# Patient Record
Sex: Female | Born: 1990 | Marital: Married | Smoking: Never smoker
Health system: Southern US, Community
[De-identification: ages and names within clinical notes are randomized; demographics above are authoritative.]

## PROBLEM LIST (undated history)

## (undated) DIAGNOSIS — B019 Varicella without complication: Secondary | ICD-10-CM

## (undated) DIAGNOSIS — J45909 Unspecified asthma, uncomplicated: Secondary | ICD-10-CM

## (undated) DIAGNOSIS — L511 Stevens-Johnson syndrome: Secondary | ICD-10-CM

## (undated) DIAGNOSIS — G43909 Migraine, unspecified, not intractable, without status migrainosus: Secondary | ICD-10-CM

## (undated) HISTORY — DX: Migraine, unspecified, not intractable, without status migrainosus: G43.909

## (undated) HISTORY — DX: Unspecified asthma, uncomplicated: J45.909

## (undated) HISTORY — PX: NO PAST SURGERIES: SHX2092

## (undated) HISTORY — DX: Varicella without complication: B01.9

---

## 2013-02-12 DIAGNOSIS — D649 Anemia, unspecified: Secondary | ICD-10-CM | POA: Insufficient documentation

## 2016-06-09 ENCOUNTER — Ambulatory Visit (INDEPENDENT_AMBULATORY_CARE_PROVIDER_SITE_OTHER): Payer: BC Managed Care – PPO | Admitting: Family Medicine

## 2016-06-09 ENCOUNTER — Encounter: Payer: Self-pay | Admitting: Family Medicine

## 2016-06-09 VITALS — BP 108/88 | HR 77 | Temp 98.3°F | Resp 14 | Ht 68.0 in | Wt 168.5 lb

## 2016-06-09 DIAGNOSIS — R0789 Other chest pain: Secondary | ICD-10-CM

## 2016-06-09 DIAGNOSIS — Z0000 Encounter for general adult medical examination without abnormal findings: Secondary | ICD-10-CM | POA: Insufficient documentation

## 2016-06-09 DIAGNOSIS — Z0001 Encounter for general adult medical examination with abnormal findings: Secondary | ICD-10-CM | POA: Diagnosis not present

## 2016-06-09 DIAGNOSIS — R079 Chest pain, unspecified: Secondary | ICD-10-CM | POA: Insufficient documentation

## 2016-06-09 LAB — COMPREHENSIVE METABOLIC PANEL
ALBUMIN: 4.7 g/dL (ref 3.5–5.2)
ALK PHOS: 50 U/L (ref 39–117)
ALT: 10 U/L (ref 0–35)
AST: 13 U/L (ref 0–37)
BILIRUBIN TOTAL: 0.5 mg/dL (ref 0.2–1.2)
BUN: 14 mg/dL (ref 6–23)
CALCIUM: 9.7 mg/dL (ref 8.4–10.5)
CO2: 29 mEq/L (ref 19–32)
CREATININE: 0.77 mg/dL (ref 0.40–1.20)
Chloride: 104 mEq/L (ref 96–112)
GFR: 95.37 mL/min (ref >60.00)
Glucose, Bld: 78 mg/dL (ref 70–99)
Potassium: 3.7 mEq/L (ref 3.5–5.1)
Sodium: 139 mEq/L (ref 135–145)
Total Protein: 8.1 g/dL (ref 6.0–8.3)

## 2016-06-09 LAB — CBC
HCT: 41.4 % (ref 36.0–46.0)
Hemoglobin: 13.9 g/dL (ref 12.0–15.0)
MCHC: 33.7 g/dL (ref 30.0–36.0)
MCV: 90.1 fl (ref 78.0–100.0)
PLATELETS: 275 10*3/uL (ref 150.0–400.0)
RBC: 4.6 Mil/uL (ref 3.87–5.11)
RDW: 12.6 % (ref 11.5–15.5)
WBC: 4.9 10*3/uL (ref 4.0–10.5)

## 2016-06-09 LAB — LIPID PANEL
CHOLESTEROL: 173 mg/dL (ref 0–200)
HDL: 63 mg/dL (ref >39.00)
LDL Cholesterol: 101 mg/dL — ABNORMAL HIGH (ref 0–99)
NonHDL: 110.17
TRIGLYCERIDES: 47 mg/dL (ref 0.0–149.0)
Total CHOL/HDL Ratio: 3
VLDL: 9.4 mg/dL (ref 0.0–40.0)

## 2016-06-09 LAB — TSH: TSH: 1 u[IU]/mL (ref 0.35–4.50)

## 2016-06-09 MED ORDER — MELOXICAM 15 MG PO TABS: 15.0000 mg | ORAL_TABLET | Freq: Every day | ORAL | 0 refills | Status: DC | PRN

## 2016-06-09 NOTE — Assessment & Plan Note (Signed)
New problem. History and physical exam consistent with musculoskeletal etiology. Treating with mobic. No indication for further work up given presentation, age and lack of risk factors.

## 2016-06-09 NOTE — Progress Notes (Signed)
Pre visit review using our clinic review tool, if applicable. No additional management support is needed unless otherwise documented below in the visit note. 

## 2016-06-09 NOTE — Patient Instructions (Signed)
Your chest pain is muscular in origin. Use the medication daily as needed.  We will call with your lab results.  Follow up annually or sooner if needed  Take care  Dr. Lacinda Axon  Health Maintenance, Female Adopting a healthy lifestyle and getting preventive care can go a long way to promote health and wellness. Talk with your health care provider about what schedule of regular examinations is right for you. This is a good chance for you to check in with your provider about disease prevention and staying healthy. In between checkups, there are plenty of things you can do on your own. Experts have done a lot of research about which lifestyle changes and preventive measures are most likely to keep you healthy. Ask your health care provider for more information. WEIGHT AND DIET  Eat a healthy diet  Be sure to include plenty of vegetables, fruits, low-fat dairy products, and lean protein.  Do not eat a lot of foods high in solid fats, added sugars, or salt.  Get regular exercise. This is one of the most important things you can do for your health.  Most adults should exercise for at least 150 minutes each week. The exercise should increase your heart rate and make you sweat (moderate-intensity exercise).  Most adults should also do strengthening exercises at least twice a week. This is in addition to the moderate-intensity exercise.  Maintain a healthy weight  Body mass index (BMI) is a measurement that can be used to identify possible weight problems. It estimates body fat based on height and weight. Your health care provider can help determine your BMI and help you achieve or maintain a healthy weight.  For females 23 years of age and older:   A BMI below 18.5 is considered underweight.  A BMI of 18.5 to 24.9 is normal.  A BMI of 25 to 29.9 is considered overweight.  A BMI of 30 and above is considered obese.  Watch levels of cholesterol and blood lipids  You should start having  your blood tested for lipids and cholesterol at 27 years of age, then have this test every 5 years.  You may need to have your cholesterol levels checked more often if:  Your lipid or cholesterol levels are high.  You are older than 27 years of age.  You are at high risk for heart disease.  CANCER SCREENING   Lung Cancer  Lung cancer screening is recommended for adults 97-1 years old who are at high risk for lung cancer because of a history of smoking.  A yearly low-dose CT scan of the lungs is recommended for people who:  Currently smoke.  Have quit within the past 15 years.  Have at least a 30-pack-year history of smoking. A pack year is smoking an average of one pack of cigarettes a day for 1 year.  Yearly screening should continue until it has been 15 years since you quit.  Yearly screening should stop if you develop a health problem that would prevent you from having lung cancer treatment.  Breast Cancer  Practice breast self-awareness. This means understanding how your breasts normally appear and feel.  It also means doing regular breast self-exams. Let your health care provider know about any changes, no matter how small.  If you are in your 20s or 30s, you should have a clinical breast exam (CBE) by a health care provider every 1-3 years as part of a regular health exam.  If you are 40 or older,  have a CBE every year. Also consider having a breast X-ray (mammogram) every year.  If you have a family history of breast cancer, talk to your health care provider about genetic screening.  If you are at high risk for breast cancer, talk to your health care provider about having an MRI and a mammogram every year.  Breast cancer gene (BRCA) assessment is recommended for women who have family members with BRCA-related cancers. BRCA-related cancers include:  Breast.  Ovarian.  Tubal.  Peritoneal cancers.  Results of the assessment will determine the need for genetic  counseling and BRCA1 and BRCA2 testing. Cervical Cancer Your health care provider may recommend that you be screened regularly for cancer of the pelvic organs (ovaries, uterus, and vagina). This screening involves a pelvic examination, including checking for microscopic changes to the surface of your cervix (Pap test). You may be encouraged to have this screening done every 3 years, beginning at age 21.  For women ages 30-65, health care providers may recommend pelvic exams and Pap testing every 3 years, or they may recommend the Pap and pelvic exam, combined with testing for human papilloma virus (HPV), every 5 years. Some types of HPV increase your risk of cervical cancer. Testing for HPV may also be done on women of any age with unclear Pap test results.  Other health care providers may not recommend any screening for nonpregnant women who are considered low risk for pelvic cancer and who do not have symptoms. Ask your health care provider if a screening pelvic exam is right for you.  If you have had past treatment for cervical cancer or a condition that could lead to cancer, you need Pap tests and screening for cancer for at least 20 years after your treatment. If Pap tests have been discontinued, your risk factors (such as having a new sexual partner) need to be reassessed to determine if screening should resume. Some women have medical problems that increase the chance of getting cervical cancer. In these cases, your health care provider may recommend more frequent screening and Pap tests. Colorectal Cancer  This type of cancer can be detected and often prevented.  Routine colorectal cancer screening usually begins at 27 years of age and continues through 27 years of age.  Your health care provider may recommend screening at an earlier age if you have risk factors for colon cancer.  Your health care provider may also recommend using home test kits to check for hidden blood in the stool.  A  small camera at the end of a tube can be used to examine your colon directly (sigmoidoscopy or colonoscopy). This is done to check for the earliest forms of colorectal cancer.  Routine screening usually begins at age 50.  Direct examination of the colon should be repeated every 5-10 years through 27 years of age. However, you may need to be screened more often if early forms of precancerous polyps or small growths are found. Skin Cancer  Check your skin from head to toe regularly.  Tell your health care provider about any new moles or changes in moles, especially if there is a change in a mole's shape or color.  Also tell your health care provider if you have a mole that is larger than the size of a pencil eraser.  Always use sunscreen. Apply sunscreen liberally and repeatedly throughout the day.  Protect yourself by wearing long sleeves, pants, a wide-brimmed hat, and sunglasses whenever you are outside. HEART DISEASE, DIABETES, AND   HIGH BLOOD PRESSURE   High blood pressure causes heart disease and increases the risk of stroke. High blood pressure is more likely to develop in:  People who have blood pressure in the high end of the normal range (130-139/85-89 mm Hg).  People who are overweight or obese.  People who are African American.  If you are 32-68 years of age, have your blood pressure checked every 3-5 years. If you are 41 years of age or older, have your blood pressure checked every year. You should have your blood pressure measured twice--once when you are at a hospital or clinic, and once when you are not at a hospital or clinic. Record the average of the two measurements. To check your blood pressure when you are not at a hospital or clinic, you can use:  An automated blood pressure machine at a pharmacy.  A home blood pressure monitor.  If you are between 56 years and 69 years old, ask your health care provider if you should take aspirin to prevent strokes.  Have  regular diabetes screenings. This involves taking a blood sample to check your fasting blood sugar level.  If you are at a normal weight and have a low risk for diabetes, have this test once every three years after 27 years of age.  If you are overweight and have a high risk for diabetes, consider being tested at a younger age or more often. PREVENTING INFECTION  Hepatitis B  If you have a higher risk for hepatitis B, you should be screened for this virus. You are considered at high risk for hepatitis B if:  You were born in a country where hepatitis B is common. Ask your health care provider which countries are considered high risk.  Your parents were born in a high-risk country, and you have not been immunized against hepatitis B (hepatitis B vaccine).  You have HIV or AIDS.  You use needles to inject street drugs.  You live with someone who has hepatitis B.  You have had sex with someone who has hepatitis B.  You get hemodialysis treatment.  You take certain medicines for conditions, including cancer, organ transplantation, and autoimmune conditions. Hepatitis C  Blood testing is recommended for:  Everyone born from [birth year] through 1967.  Anyone with known risk factors for hepatitis C. Sexually transmitted infections (STIs)  You should be screened for sexually transmitted infections (STIs) including gonorrhea and chlamydia if:  You are sexually active and are younger than 27 years of age.  You are older than 27 years of age and your health care provider tells you that you are at risk for this type of infection.  Your sexual activity has changed since you were last screened and you are at an increased risk for chlamydia or gonorrhea. Ask your health care provider if you are at risk.  If you do not have HIV, but are at risk, it may be recommended that you take a prescription medicine daily to prevent HIV infection. This is called pre-exposure prophylaxis (PrEP). You are  considered at risk if:  You are sexually active and do not regularly use condoms or know the HIV status of your partner(s).  You take drugs by injection.  You are sexually active with a partner who has HIV. Talk with your health care provider about whether you are at high risk of being infected with HIV. If you choose to begin PrEP, you should first be tested for HIV. You should then be tested  every 3 months for as long as you are taking PrEP.  PREGNANCY   If you are premenopausal and you may become pregnant, ask your health care provider about preconception counseling.  If you may become pregnant, take 400 to 800 micrograms (mcg) of folic acid every day.  If you want to prevent pregnancy, talk to your health care provider about birth control (contraception). OSTEOPOROSIS AND MENOPAUSE   Osteoporosis is a disease in which the bones lose minerals and strength with aging. This can result in serious bone fractures. Your risk for osteoporosis can be identified using a bone density scan.  If you are 45 years of age or older, or if you are at risk for osteoporosis and fractures, ask your health care provider if you should be screened.  Ask your health care provider whether you should take a calcium or vitamin D supplement to lower your risk for osteoporosis.  Menopause may have certain physical symptoms and risks.  Hormone replacement therapy may reduce some of these symptoms and risks. Talk to your health care provider about whether hormone replacement therapy is right for you.  HOME CARE INSTRUCTIONS   Schedule regular health, dental, and eye exams.  Stay current with your immunizations.   Do not use any tobacco products including cigarettes, chewing tobacco, or electronic cigarettes.  If you are pregnant, do not drink alcohol.  If you are breastfeeding, limit how much and how often you drink alcohol.  Limit alcohol intake to no more than 1 drink per day for nonpregnant women. One  drink equals 12 ounces of beer, 5 ounces of wine, or 1 ounces of hard liquor.  Do not use street drugs.  Do not share needles.  Ask your health care provider for help if you need support or information about quitting drugs.  Tell your health care provider if you often feel depressed.  Tell your health care provider if you have ever been abused or do not feel safe at home.   This information is not intended to replace advice given to you by your health care provider. Make sure you discuss any questions you have with your health care provider.   Document Released: 01/31/2011 Document Revised: 08/08/2014 Document Reviewed: 06/19/2013 Elsevier Interactive Patient Education [employer].

## 2016-06-09 NOTE — Progress Notes (Signed)
Subjective:  Patient ID: Rachel Compton, female    DOB: June 27, 1991  Age: 25 y.o. MRN: [id number]  CC: Establish care/physical, chest pain  HPI Rachel Compton is a 25 y.o. female presents to the clinic today to establish care and for a physical exam.  She complains of chest pain.  Preventative Healthcare  Pap smear: Up to date.  Immunizations  Tetanus - Up to date.  Flu - Declines.   Labs: Labs today.  Exercise: No regular exercise.  Alcohol use: See below.  Smoking/tobacco use: Occasional smoker (Black and Mild)  STD/HIV testing: Has had screening with most recent pregnancy.  Chest pain  Patient reports that she's had chest pain since this past Saturday.  Pain is located in the mid sternum.  She describes it as a pressure sensation.  Mild in severity.  No radiation.  No diaphoresis or nausea/vomiting. No shortness of breath.  She states is worse with movement and with lying on her stomach.  No known relieving factors.  No medications or interventions tried.  No risk factors.  No other complaints at this time.   PMH, Surgical Hx, Family Hx, Social History reviewed and updated as below.  Past Medical History:  Diagnosis Date  . Asthma   . Chicken pox    Past Surgical History:  Procedure Laterality Date  . NO PAST SURGERIES     Family History  Problem Relation Age of Onset  . Adopted: Yes   Social History  Substance Use Topics  . Smoking status: Light Tobacco Smoker  . Smokeless tobacco: Never Used  . Alcohol use 3.0 oz/week    5 Standard drinks or equivalent per week   Review of Systems  Constitutional: Positive for fatigue.  Cardiovascular: Positive for chest pain.  Neurological: Positive for headaches.  All other systems reviewed and are negative.  Objective:   Today's Vitals: BP 108/88 (BP Location: Right Arm, Patient Position: Sitting, Cuff Size: Normal)   Pulse 77   Temp 98.3 F (36.8 C) (Oral)   Resp 14   Ht _0  (1.727 m)    Wt 168 lb 8 oz (76.4 kg)   SpO2 98%   BMI 25.62 kg/m   Physical Exam  Constitutional: She is oriented to person, place, and time. She appears well-developed and well-nourished. No distress.  HENT:  Head: Normocephalic and atraumatic.  Nose: Nose normal.  Mouth/Throat: Oropharynx is clear and moist. No oropharyngeal exudate.  Normal TM's bilaterally.   Eyes: Conjunctivae are normal. No scleral icterus.  Neck: Neck supple.  ? Thyromegaly.  Cardiovascular: Normal rate and regular rhythm.   No murmur heard. Pulmonary/Chest: Effort normal and breath sounds normal. She has no wheezes. She has no rales. She exhibits tenderness.  Abdominal: Soft. She exhibits no distension. There is no tenderness. There is no rebound and no guarding.  Musculoskeletal: Normal range of motion. She exhibits no edema.  Lymphadenopathy:    She has no cervical adenopathy.  Neurological: She is alert and oriented to person, place, and time.  Skin: Skin is warm and dry. No rash noted.  Psychiatric: She has a normal mood and affect.  Vitals reviewed.  Assessment & Plan:   Problem List Items Addressed This Visit    Chest pain    New problem. History and physical exam consistent with musculoskeletal etiology. Treating with mobic. No indication for further work up given presentation, age and lack of risk factors.      Annual physical exam - Primary    Pap  smear up to date. Declined flu vaccine. Tetanus up-to-date. Screening labs today.      Relevant Orders   CBC   Comp Met (CMET)   Lipid panel   TSH      Outpatient Encounter Prescriptions as of 06/09/2016  Medication Sig  . meloxicam (MOBIC) 15 MG tablet Take 1 tablet (15 mg total) by mouth daily as needed for pain.   No facility-administered encounter medications on file as of 06/09/2016.     Follow-up: Annually  [place]

## 2016-06-09 NOTE — Assessment & Plan Note (Signed)
Pap smear up to date. Declined flu vaccine. Tetanus up-to-date. Screening labs today.

## 2016-06-10 ENCOUNTER — Encounter: Payer: Self-pay | Admitting: *Deleted

## 2016-12-28 ENCOUNTER — Ambulatory Visit (INDEPENDENT_AMBULATORY_CARE_PROVIDER_SITE_OTHER): Payer: BC Managed Care – PPO | Admitting: Family Medicine

## 2016-12-28 DIAGNOSIS — J329 Chronic sinusitis, unspecified: Secondary | ICD-10-CM | POA: Insufficient documentation

## 2016-12-28 DIAGNOSIS — J019 Acute sinusitis, unspecified: Secondary | ICD-10-CM

## 2016-12-28 MED ORDER — HYDROCOD POLST-CPM POLST ER 10-8 MG/5ML PO SUER: 5.0000 mL | Freq: Two times a day (BID) | ORAL | 0 refills | Status: DC | PRN

## 2016-12-28 MED ORDER — DOXYCYCLINE HYCLATE 100 MG PO TABS: 100.0000 mg | ORAL_TABLET | Freq: Two times a day (BID) | ORAL | 0 refills | Status: DC

## 2016-12-28 NOTE — Progress Notes (Signed)
Subjective:  Patient ID: Rachel Compton, female    DOB: April 14, 1991  Age: 26 y.o. MRN: [id number]  CC: URI symptoms.  HPI:  26 year old female presents with URI symptoms.  Patient reports that she has been sick since Friday. She has been experiencing severe nasal/sinus congestion and cough. Cough is worse at night. Yellow mucus when blowing nose. No fever. She has been using Claritin D, nasal saline, theraflu without improvement. No known exacerbating factors. No other associated symptoms.  Additionally, patient reports that she has developed several palpable nodules on her arms over the past months. Nontender. Not bothersome. She is curious to what the cause is.  Social Hx   Social History   Social History  . Marital status: Single    Spouse name: N/A  . Number of children: N/A  . Years of education: N/A   Social History Main Topics  . Smoking status: Light Tobacco Smoker  . Smokeless tobacco: Never Used  . Alcohol use 3.0 oz/week    5 Standard drinks or equivalent per week  . Drug use: No  . Sexual activity: Yes    Partners: Male   Other Topics Concern  . Not on file   Social History Narrative  . No narrative on file    Review of Systems  HENT: Positive for congestion.   Respiratory: Positive for cough.    Objective:  BP 108/76 (BP Location: Right Arm, Patient Position: Sitting, Cuff Size: Normal)   Pulse 83   Temp 98.5 F (36.9 C) (Oral)   Resp 16   Wt 168 lb 4 oz (76.3 kg)   SpO2 96%   BMI 25.58 kg/m   BP/Weight 12/28/2016 06/09/2016  Systolic BP 108 108  Diastolic BP 76 88  Wt. (Lbs) 168.25 168.5  BMI 25.58 25.62    Physical Exam  Constitutional: She appears well-developed. No distress.  HENT:  Head: Normocephalic and atraumatic.  Mouth/Throat: Oropharynx is clear and moist.  Neck: Neck supple.  Cardiovascular: Normal rate and regular rhythm.   No murmur heard. Pulmonary/Chest: Effort normal and breath sounds normal. She has no wheezes. She has  no rales.  Lymphadenopathy:    She has no cervical adenopathy.  Skin:  Patient has several palpable nodules (in the subcutaneous tissue) on both forearms (ventrally; distal to the elbow).   Vitals reviewed.   Lab Results  Component Value Date   WBC 4.9 06/09/2016   HGB 13.9 06/09/2016   HCT 41.4 06/09/2016   PLT 275.0 06/09/2016   GLUCOSE 78 06/09/2016   CHOL 173 06/09/2016   TRIG 47.0 06/09/2016   HDL 63.00 06/09/2016   LDLCALC 101 (H) 06/09/2016   ALT 10 06/09/2016   AST 13 06/09/2016   NA 139 06/09/2016   K 3.7 06/09/2016   CL 104 06/09/2016   CREATININE 0.77 06/09/2016   BUN 14 06/09/2016   CO2 29 06/09/2016   TSH 1.00 06/09/2016    Assessment & Plan:   Problem List Items Addressed This Visit      Respiratory   Acute sinusitis    New problem.  Treating with Doxy (given PCN allergy). Tussionex for cough.       Relevant Medications   doxycycline (VIBRA-TABS) 100 MG tablet   chlorpheniramine-HYDROcodone (TUSSIONEX PENNKINETIC ER) 10-8 MG/5ML SUER      Meds ordered this encounter  Medications  . doxycycline (VIBRA-TABS) 100 MG tablet    Sig: Take 1 tablet (100 mg total) by mouth 2 (two) times daily.  Dispense:  14 tablet    Refill:  0  . chlorpheniramine-HYDROcodone (TUSSIONEX PENNKINETIC ER) 10-8 MG/5ML SUER    Sig: Take 5 mLs by mouth every 12 (twelve) hours as needed.    Dispense:  115 mL    Refill:  0   Follow-up: PRN  Everlene Other DO [facility]

## 2016-12-28 NOTE — Patient Instructions (Signed)
Medications as prescribed.  Feel better.   Sinusitis, Adult Sinusitis is soreness and inflammation of your sinuses. Sinuses are hollow spaces in the bones around your face. Your sinuses are located:  Around your eyes.  In the middle of your forehead.  Behind your nose.  In your cheekbones. Your sinuses and nasal passages are lined with a stringy fluid (mucus). Mucus normally drains out of your sinuses. When your nasal tissues become inflamed or swollen, the mucus can become trapped or blocked so air cannot flow through your sinuses. This allows bacteria, viruses, and funguses to grow, which leads to infection. Sinusitis can develop quickly and last for 7?10 days (acute) or for more than 12 weeks (chronic). Sinusitis often develops after a cold. What are the causes? This condition is caused by anything that creates swelling in the sinuses or stops mucus from draining, including:  Allergies.  Asthma.  Bacterial or viral infection.  Abnormally shaped bones between the nasal passages.  Nasal growths that contain mucus (nasal polyps).  Narrow sinus openings.  Pollutants, such as chemicals or irritants in the air.  A foreign object stuck in the nose.  A fungal infection. This is rare. What increases the risk? The following factors may make you more likely to develop this condition:  Having allergies or asthma.  Having had a recent cold or respiratory tract infection.  Having structural deformities or blockages in your nose or sinuses.  Having a weak immune system.  Doing a lot of swimming or diving.  Overusing nasal sprays.  Smoking. What are the signs or symptoms? The main symptoms of this condition are pain and a feeling of pressure around the affected sinuses. Other symptoms include:  Upper toothache.  Earache.  Headache.  Bad breath.  Decreased sense of smell and taste.  A cough that may get worse at night.  Fatigue.  Fever.  Thick drainage from  your nose. The drainage is often green and it may contain pus (purulent).  Stuffy nose or congestion.  Postnasal drip. This is when extra mucus collects in the throat or back of the nose.  Swelling and warmth over the affected sinuses.  Sore throat.  Sensitivity to light. How is this diagnosed? This condition is diagnosed based on symptoms, a medical history, and a physical exam. To find out if your condition is acute or chronic, your health care provider may:  Look in your nose for signs of nasal polyps.  Tap over the affected sinus to check for signs of infection.  View the inside of your sinuses using an imaging device that has a light attached (endoscope). If your health care provider suspects that you have chronic sinusitis, you may also:  Be tested for allergies.  Have a sample of mucus taken from your nose (nasal culture) and checked for bacteria.  Have a mucus sample examined to see if your sinusitis is related to an allergy. If your sinusitis does not respond to treatment and it lasts longer than 8 weeks, you may have an MRI or CT scan to check your sinuses. These scans also help to determine how severe your infection is. In rare cases, a bone biopsy may be done to rule out more serious types of fungal sinus disease. How is this treated? Treatment for sinusitis depends on the cause and whether your condition is chronic or acute. If a virus is causing your sinusitis, your symptoms will go away on their own within 10 days. You may be given medicines to relieve  your symptoms, including:  Topical nasal decongestants. They shrink swollen nasal passages and let mucus drain from your sinuses.  Antihistamines. These drugs block inflammation that is triggered by allergies. This can help to ease swelling in your nose and sinuses.  Topical nasal corticosteroids. These are nasal sprays that ease inflammation and swelling in your nose and sinuses.  Nasal saline washes. These rinses  can help to get rid of thick mucus in your nose. If your condition is caused by bacteria, you will be given an antibiotic medicine. If your condition is caused by a fungus, you will be given an antifungal medicine. Surgery may be needed to correct underlying conditions, such as narrow nasal passages. Surgery may also be needed to remove polyps. Follow these instructions at home: Medicines   Take, use, or apply over-the-counter and prescription medicines only as told by your health care provider. These may include nasal sprays.  If you were prescribed an antibiotic medicine, take it as told by your health care provider. Do not stop taking the antibiotic even if you start to feel better. Hydrate and Humidify   Drink enough water to keep your urine clear or pale yellow. Staying hydrated will help to thin your mucus.  Use a cool mist humidifier to keep the humidity level in your home above 50%.  Inhale steam for 10-15 minutes, 3-4 times a day or as told by your health care provider. You can do this in the bathroom while a hot shower is running.  Limit your exposure to cool or dry air. Rest   Rest as much as possible.  Sleep with your head raised (elevated).  Make sure to get enough sleep each night. General instructions   Apply a warm, moist washcloth to your face 3-4 times a day or as told by your health care provider. This will help with discomfort.  Wash your hands often with soap and water to reduce your exposure to viruses and other germs. If soap and water are not available, use hand sanitizer.  Do not smoke. Avoid being around people who are smoking (secondhand smoke).  Keep all follow-up visits as told by your health care provider. This is important. Contact a health care provider if:  You have a fever.  Your symptoms get worse.  Your symptoms do not improve within 10 days. Get help right away if:  You have a severe headache.  You have persistent vomiting.  You have  pain or swelling around your face or eyes.  You have vision problems.  You develop confusion.  Your neck is stiff.  You have trouble breathing. This information is not intended to replace advice given to you by your health care provider. Make sure you discuss any questions you have with your health care provider. Document Released: 07/18/2005 Document Revised: 03/13/2016 Document Reviewed: 05/13/2015 Elsevier Interactive Patient Education  2017 [employer].

## 2016-12-28 NOTE — Assessment & Plan Note (Signed)
New problem.  Treating with Doxy (given PCN allergy). Tussionex for cough.

## 2017-01-02 ENCOUNTER — Telehealth: Payer: Self-pay | Admitting: *Deleted

## 2017-01-02 ENCOUNTER — Other Ambulatory Visit: Payer: Self-pay | Admitting: Family Medicine

## 2017-01-02 MED ORDER — LEVOFLOXACIN 500 MG PO TABS: 500.0000 mg | ORAL_TABLET | Freq: Every day | ORAL | 0 refills | Status: DC

## 2017-01-02 NOTE — Telephone Encounter (Signed)
Patient stopped her antibiotic on 12/30/16, due to it making her sick , pt has only taken 3 doses. Please advise if she should have another antibiotic called into the pharmacy  Pt contact [phone]

## 2017-01-02 NOTE — Telephone Encounter (Signed)
Stop doxy. Start levaquin. Rx sent.

## 2017-01-02 NOTE — Telephone Encounter (Signed)
Patient came in for sinusitis, was given Doxycycline, please advise as this is making her sick. thanks

## 2017-01-03 NOTE — Telephone Encounter (Signed)
Spoke with the patient and gave new information.  She will pick up new RX today, thanks

## 2017-02-23 ENCOUNTER — Ambulatory Visit (INDEPENDENT_AMBULATORY_CARE_PROVIDER_SITE_OTHER): Payer: BC Managed Care – PPO | Admitting: Family

## 2017-02-23 ENCOUNTER — Ambulatory Visit: Payer: BC Managed Care – PPO | Admitting: Family Medicine

## 2017-02-23 ENCOUNTER — Encounter: Payer: Self-pay | Admitting: Family

## 2017-02-23 VITALS — BP 124/70 | HR 60 | Temp 97.9°F | Resp 12 | Ht 68.0 in | Wt 170.0 lb

## 2017-02-23 DIAGNOSIS — R519 Headache, unspecified: Secondary | ICD-10-CM | POA: Insufficient documentation

## 2017-02-23 DIAGNOSIS — R51 Headache: Secondary | ICD-10-CM

## 2017-02-23 DIAGNOSIS — R140 Abdominal distension (gaseous): Secondary | ICD-10-CM

## 2017-02-23 LAB — POCT URINE PREGNANCY: Preg Test, Ur: NEGATIVE

## 2017-02-23 MED ORDER — FLUTICASONE PROPIONATE 50 MCG/ACT NA SUSP: 2.0000 | Freq: Every day | NASAL | 2 refills | Status: DC

## 2017-02-23 NOTE — Patient Instructions (Addendum)
As discussed today, I'm reassured by your neurologic exam. If you change your mind and would like an injection inour office, please let us know. Otherwise as discussed, hydration is key, and taking Excedrin may cause rebound headache. I advise you to try to taper off on these medications.  As also discussed, your headache may be from sinus pressure, allergies, stay on the Claritin and startFlonase see if this helps with the headache trigger.  As far as bloating, nausea, you seem to be a bit constipated. may start over-the-counter Metamucil which you can add to water.   increase her water intake  may also try a stool softener such as Colace   Please follow-up in a couple weeks so we can regroup and ensure the symptoms have improved. Let me know if symptoms worsen.    General Headache Without Cause A headache is pain or discomfort felt around the head or neck area. The specific cause of a headache may not be found. There are many causes and types of headaches. A few common ones are:  Tension headaches.  Migraine headaches.  Cluster headaches.  Chronic daily headaches.  Follow these instructions at home: Watch your condition for any changes. Take these steps to help with your condition: Managing pain  Take over-the-counter and prescription medicines only as told by your health care provider.  Lie down in a dark, quiet room when you have a headache.  If directed, apply ice to the head and neck area: ? Put ice in a plastic bag. ? Place a towel between your skin and the bag. ? Leave the ice on for 20 minutes, 2-3 times per day.  Use a heating pad or hot shower to apply heat to the head and neck area as told by your health care provider.  Keep lights dim if bright lights bother you or make your headaches worse. Eating and drinking  Eat meals on a regular schedule.  Limit alcohol use.  Decrease the amount of caffeine you drink, or stop drinking caffeine. General  instructions  Keep all follow-up visits as told by your health care provider. This is important.  Keep a headache journal to help find out what may trigger your headaches. For example, write down: ? What you eat and drink. ? How much sleep you get. ? Any change to your diet or medicines.  Try massage or other relaxation techniques.  Limit stress.  Sit up straight, and do not tense your muscles.  Do not use tobacco products, including cigarettes, chewing tobacco, or e-cigarettes. If you need help quitting, ask your health care provider.  Exercise regularly as told by your health care provider.  Sleep on a regular schedule. Get 7-9 hours of sleep, or the amount recommended by your health care provider. Contact a health care provider if:  Your symptoms are not helped by medicine.  You have a headache that is different from the usual headache.  You have nausea or you vomit.  You have a fever. Get help right away if:  Your headache becomes severe.  You have repeated vomiting.  You have a stiff neck.  You have a loss of vision.  You have problems with speech.  You have pain in the eye or ear.  You have muscular weakness or loss of muscle control.  You lose your balance or have trouble walking.  You feel faint or pass out.  You have confusion. This information is not intended to replace advice given to you by your health  care provider. Make sure you discuss any questions you have with your health care provider. Document Released: 07/18/2005 Document Revised: 12/24/2015 Document Reviewed: 11/10/2014 Elsevier Interactive Patient Education  2017 [employer]

## 2017-02-23 NOTE — Progress Notes (Signed)
Subjective:    Patient ID: Rachel Compton, female    DOB: [birth year], 26 y.o.   MRN: [id number]  CC: Rachel Compton is a 26 y.o. female who presents today for an acute visit.    HPI: Complains of HA x one week, comes and goes , better this afternoon.  Describes as bilateral temporal region. Rates 6/10. Pressure, throbbing. 'Not worst HA of life.'  Excredrin helps. Waking up with headache or gets in the afternoon. Endorses N 'a little' with HA, photosensitive. Sinus drainage, post nasal drip contributing. No vomiting, F, chills, vision changes, confusion., ear pain, sore throat.  On claritin.   H/o migraine when in highschool and 'went away.' Also complains of nausea and bloating x 2 weeks,  Improves with gas x and pepto bismal . No sudden dietary changes. Fried foods will bother stomach. NO blood in stool. Haven't eating anything suspicious or had recent antibiotics. Last BM 2 days ago. Not eating very healthy.   Negative pregnancy test at home  On mirena     No ckd. HISTORY:  Past Medical History:  Diagnosis Date  . Asthma   . Chicken pox    Past Surgical History:  Procedure Laterality Date  . NO PAST SURGERIES     Family History  Problem Relation Age of Onset  . Adopted: Yes    Allergies: Penicillins Current Outpatient Prescriptions on File Prior to Visit  Medication Sig Dispense Refill  . chlorpheniramine-HYDROcodone (TUSSIONEX PENNKINETIC ER) 10-8 MG/5ML SUER Take 5 mLs by mouth every 12 (twelve) hours as needed. (Patient not taking: Reported on 02/23/2017) 115 mL 0  . doxycycline (VIBRA-TABS) 100 MG tablet Take 1 tablet (100 mg total) by mouth 2 (two) times daily. (Patient not taking: Reported on 02/23/2017) 14 tablet 0  . levofloxacin (LEVAQUIN) 500 MG tablet Take 1 tablet (500 mg total) by mouth daily. (Patient not taking: Reported on 02/23/2017) 7 tablet 0   No current facility-administered medications on file prior to visit.     Social History  Substance Use  Topics  . Smoking status: Light Tobacco Smoker  . Smokeless tobacco: Never Used  . Alcohol use 3.0 oz/week    5 Standard drinks or equivalent per week    Review of Systems  Constitutional: Negative for chills and fever.  Eyes: Negative for visual disturbance.  Respiratory: Negative for cough.   Cardiovascular: Negative for chest pain and palpitations.  Gastrointestinal: Positive for nausea. Negative for abdominal distention, abdominal pain and vomiting.  Neurological: Positive for headaches.      Objective:    BP 124/70 (BP Location: Left Arm, Patient Position: Sitting, Cuff Size: Normal)   Pulse 60   Temp 97.9 F (36.6 C) (Oral)   Resp 12   Ht 5\' 8"  (1.727 m)   Wt 170 lb (77.1 kg)   LMP 01/18/2017 (Approximate)   SpO2 100%   BMI 25.85 kg/m    Physical Exam  Constitutional: She appears well-developed and well-nourished.  HENT:  Mouth/Throat: Uvula is midline, oropharynx is clear and moist and mucous membranes are normal.  Eyes: Pupils are equal, round, and reactive to light. Conjunctivae and EOM are normal.  Fundus normal bilaterally.   Cardiovascular: Normal rate, regular rhythm, normal heart sounds and normal pulses.   Pulmonary/Chest: Effort normal and breath sounds normal. She has no wheezes. She has no rhonchi. She has no rales.  Abdominal: Soft. Normal appearance and bowel sounds are normal. She exhibits no distension, no fluid wave, no ascites and no mass.  There is no tenderness. There is no rigidity, no rebound, no guarding and no CVA tenderness.  Neurological: She is alert. She has normal strength. No cranial nerve deficit or sensory deficit. She displays a negative Romberg sign.  Reflex Scores:      Bicep reflexes are 2+ on the right side and 2+ on the left side.      Patellar reflexes are 2+ on the right side and 2+ on the left side. Grip equal and strong bilateral upper extremities. Gait strong and steady. Able to perform rapid alternating movement without  difficulty.   Skin: Skin is warm and dry.  Psychiatric: She has a normal mood and affect. Her speech is normal and behavior is normal. Thought content normal.  Vitals reviewed.      Assessment & Plan:   Problem List Items Addressed This Visit      Other   Acute nonintractable headache - Primary    Reassured by normal neurologic exam. Offered patient IMToradol in the office, she politely declines. We jointly agreed headache could be triggered by multiple things including allergic rhinitis, stress, rebound overuse headache. We jointly agreed we'll try to treat a triggers.  patient will start on Flonase. Advised that I would patient  to come back the next couple weeks to ensure that headache has improved, sooner if not. Return precautions given.      Relevant Medications   fluticasone (FLONASE) 50 MCG/ACT nasal spray   Bloating    Reassured by benign abdominal exam. No abdominal tenderness or bloating appreciated. Discussed with patient working diagnosis of poor diet and constipation. Again, advised patient to return in couple weeks to ensure that symptom is improving. Return precautions given.      Relevant Orders   POCT urine pregnancy (Completed)         I am having Rachel Compton start on fluticasone. I am also having her maintain her doxycycline, chlorpheniramine-HYDROcodone, and levofloxacin.   Meds ordered this encounter  Medications  . fluticasone (FLONASE) 50 MCG/ACT nasal spray    Sig: Place 2 sprays into both nostrils daily.    Dispense:  16 g    Refill:  2    Order Specific Question:   Supervising Provider    Answer:   Sherlene ShamsULLO, TERESA L [[id number]]    Return precautions given.   Risks, benefits, and alternatives of the medications and treatment plan prescribed today were discussed, and patient expressed understanding.   Education regarding symptom management and diagnosis given to patient on AVS.  Continue to follow with Tommie Samsook, Jayce G, DO for routine health  maintenance.   Rachel Compton and I agreed with plan.   Rennie PlowmanMargaret Amerah Puleo, FNP

## 2017-02-23 NOTE — Assessment & Plan Note (Addendum)
Reassured by benign abdominal exam. No abdominal tenderness or bloating appreciated. Discussed with patient working diagnosis of poor diet and constipation. Again, advised patient to return in couple weeks to ensure that symptom is improving. Return precautions given.

## 2017-02-23 NOTE — Assessment & Plan Note (Addendum)
Reassured by normal neurologic exam. Offered patient IMToradol in the office, she politely declines. We jointly agreed headache could be triggered by multiple things including allergic rhinitis, stress, rebound overuse headache. We jointly agreed we'll try to treat a triggers.  patient will start on Flonase. Advised that I would patient  to come back the next couple weeks to ensure that headache has improved, sooner if not. Return precautions given.

## 2017-05-01 ENCOUNTER — Telehealth: Payer: Self-pay | Admitting: Family

## 2017-05-01 ENCOUNTER — Ambulatory Visit (INDEPENDENT_AMBULATORY_CARE_PROVIDER_SITE_OTHER): Payer: BC Managed Care – PPO | Admitting: Family

## 2017-05-01 ENCOUNTER — Encounter: Payer: Self-pay | Admitting: Family

## 2017-05-01 VITALS — BP 112/80 | HR 76 | Temp 97.8°F | Ht 68.0 in | Wt 173.0 lb

## 2017-05-01 DIAGNOSIS — M62838 Other muscle spasm: Secondary | ICD-10-CM

## 2017-05-01 DIAGNOSIS — R21 Rash and other nonspecific skin eruption: Secondary | ICD-10-CM

## 2017-05-01 MED ORDER — CYCLOBENZAPRINE HCL 5 MG PO TABS: 5.0000 mg | ORAL_TABLET | Freq: Every evening | ORAL | 0 refills | Status: DC | PRN

## 2017-05-01 NOTE — Progress Notes (Signed)
Pre visit review using our clinic review tool, if applicable. No additional management support is needed unless otherwise documented below in the visit note. 

## 2017-05-01 NOTE — Patient Instructions (Signed)
Heat Ibuprofen  Flexeril at bedtime  Do not drive or operate heavy machinery while on muscle relaxant. Please do not drink alcohol. Only take this medication as needed for acute muscle spasm at bedtime. This medication make you feel drowsy so be very careful.  Stop taking if become too drowsy or somnolent as this puts you at risk for falls. Please contact our office with any questions.    Let us know if not better or you do not hear regarding dermatology ( referral placed)   Muscle Cramps and Spasms Muscle cramps and spasms occur when a muscle or muscles tighten and you have no control over this tightening (involuntary muscle contraction). They are a common problem and can develop in any muscle. The most common place is in the calf muscles of the leg. Muscle cramps and muscle spasms are both involuntary muscle contractions, but there are some differences between the two:  Muscle cramps are painful. They come and go and may last a few seconds to 15 minutes. Muscle cramps are often more forceful and last longer than muscle spasms.  Muscle spasms may or may not be painful. They may also last just a few seconds or much longer.  Certain medical conditions, such as diabetes or Parkinson disease, can make it more likely to develop cramps or spasms. However, cramps or spasms are usually not caused by a serious underlying problem. Common causes include:  Overexertion.  Overuse from repetitive motions, or doing the same thing over and over.  Remaining in a certain position for a long period of time.  Improper preparation, form, or technique while playing a sport or doing an activity.  Dehydration.  Injury.  Side effects of some medicines.  Abnormally low levels of the salts and ions in your blood (electrolytes), especially potassium and calcium. This could happen if you are taking water pills (diuretics) or if you are pregnant.  In many cases, the cause of muscle cramps or spasms is  unknown. Follow these instructions at home:  Stay well hydrated. Drink enough fluid to keep your urine clear or pale yellow.  Try massaging, stretching, and relaxing the affected muscle.  If directed, apply heat to tight or tense muscles as often as told by your health care provider. Use the heat source that your health care provider recommends, such as a moist heat pack or a heating pad. ? Place a towel between your skin and the heat source. ? Leave the heat on for 20-30 minutes. ? Remove the heat if your skin turns bright red. This is especially important if you are unable to feel pain, heat, or cold. You may have a greater risk of getting burned.  If directed, put ice on the affected area. This may help if you are sore or have pain after a cramp or spasm. ? Put ice in a plastic bag. ? Place a towel between your skin and the bag. ? Leavethe ice on for 20 minutes, 2-3 times a day.  Take over-the-counter and prescription medicines only as told by your health care provider.  Pay attention to any changes in your symptoms. Contact a health care provider if:  Your cramps or spasms get more severe or happen more often.  Your cramps or spasms do not improve over time. This information is not intended to replace advice given to you by your health care provider. Make sure you discuss any questions you have with your health care provider. Document Released: 01/07/2002 Document Revised: 08/19/2015 Document Reviewed:  04/21/2015 Elsevier Interactive Patient Education  [employer].

## 2017-05-01 NOTE — Telephone Encounter (Signed)
close

## 2017-05-01 NOTE — Progress Notes (Signed)
Subjective:    Patient ID: Rachel Compton, female    DOB: 07-11-1991, 26 y.o.   MRN: [id number]  CC: Rachel Compton is a 26 y.o. female who presents today for an acute visit.    HPI: CC: left scapular pain x 4 days, improved Dull pain this morning, 'no longer sharp.'   Noticed work at [employer]. Improved with rest over weekend. No pain with putting hair up, getting dressed.  No pain not associated with eating.  No fever, HA, vision changes, numbness, tingling, abdominal pain.   No injury Tried tylenol, icy hot with improvement.   Also complains of right forearm bumps, several months. Waxing and waning in size. No discharge, non tender. Would like to see dermatology.      No ckd.  HISTORY:  Past Medical History:  Diagnosis Date  . Asthma   . Chicken pox    Past Surgical History:  Procedure Laterality Date  . NO PAST SURGERIES     Family History  Problem Relation Age of Onset  . Adopted: Yes    Allergies: Penicillins Current Outpatient Prescriptions on File Prior to Visit  Medication Sig Dispense Refill  . fluticasone (FLONASE) 50 MCG/ACT nasal spray Place 2 sprays into both nostrils daily. 16 g 2   No current facility-administered medications on file prior to visit.     Social History  Substance Use Topics  . Smoking status: Light Tobacco Smoker  . Smokeless tobacco: Never Used  . Alcohol use 3.0 oz/week    5 Standard drinks or equivalent per week    Review of Systems  Constitutional: Negative for chills and fever.  Eyes: Negative for visual disturbance.  Respiratory: Negative for cough.   Cardiovascular: Negative for chest pain and palpitations.  Gastrointestinal: Negative for abdominal pain, nausea and vomiting.  Musculoskeletal: Negative for back pain.  Neurological: Negative for numbness and headaches.      Objective:    BP 112/80   Pulse 76   Temp 97.8 F (36.6 C) (Oral)   Ht  (1.727 m)   Wt 173 lb (78.5 kg)   SpO2 99%   BMI 26.30  kg/m    Physical Exam  Constitutional: She appears well-developed and well-nourished.  Eyes: Conjunctivae are normal.  Cardiovascular: Normal rate, regular rhythm, normal heart sounds and normal pulses.   Pulmonary/Chest: Effort normal and breath sounds normal. She has no wheezes. She has no rhonchi. She has no rales.  Musculoskeletal:       Right shoulder: She exhibits pain and spasm. She exhibits normal range of motion, no tenderness, no bony tenderness and normal strength.       Arms: No neck tenderness appreciated.  Spasm pain over left trapezius as noted on diagram.   Left Shoulder:   No asymmetry of shoulders when comparing right and left.No pain with palpation over glenohumeral joint lines, [place]imb[place]on joint, AC joint, or bicipital groove. No pain with internal and external rotation. No pain with resisted lateral extension .  No rash on back.   Negative active painful arc sign. Negative passive arc ( Neer's). Negative drop arm  Strength and sensation normal BUE's.    Neurological: She is alert.  Skin: Skin is warm and dry. Rash noted. Rash is nodular.     3 discrete non tender nodules noted. Non fluctuant. No erythema, streaking.   Psychiatric: She has a normal mood and affect. Her speech is normal and behavior is normal. Thought content normal.  Vitals reviewed.  Assessment & Plan:   1. Rash Nonspecific at this time. Nodular. Considering lipoma. Referral dermatology for further evaluation. - Ambulatory referral to Dermatology  2. Muscle spasm Symptoms most consistent with muscle spasm. Flexeril qhs, ibuprofen during the day. Heat. Return precautions given.   - cyclobenzaprine (FLEXERIL) 5 MG tablet; Take 1 tablet (5 mg total) by mouth at bedtime as needed for muscle spasms.  Dispense: 15 tablet; Refill: 0    I have discontinued Ms. Herring's doxycycline, chlorpheniramine-HYDROcodone, and levofloxacin. I am also having her start on cyclobenzaprine. Additionally, I am  having her maintain her fluticasone.   Meds ordered this encounter  Medications  . cyclobenzaprine (FLEXERIL) 5 MG tablet    Sig: Take 1 tablet (5 mg total) by mouth at bedtime as needed for muscle spasms.    Dispense:  15 tablet    Refill:  0    Order Specific Question:   Supervising Provider    Answer:   Sherlene Shams [[id number]]    Return precautions given.   Risks, benefits, and alternatives of the medications and treatment plan prescribed today were discussed, and patient expressed understanding.   Education regarding symptom management and diagnosis given to patient on AVS.  Continue to follow with Tommie Sams, DO for routine health maintenance.   Rachel Compton and I agreed with plan.   [name], FNP

## 2017-09-28 ENCOUNTER — Encounter: Payer: Self-pay | Admitting: Family

## 2017-10-04 ENCOUNTER — Ambulatory Visit (INDEPENDENT_AMBULATORY_CARE_PROVIDER_SITE_OTHER): Payer: BC Managed Care – PPO | Admitting: Family

## 2017-10-04 ENCOUNTER — Other Ambulatory Visit (HOSPITAL_COMMUNITY)
Admission: RE | Admit: 2017-10-04 | Discharge: 2017-10-04 | Disposition: A | Discharge status: Home / Self Care | Payer: BC Managed Care – PPO | Source: Ambulatory Visit | Attending: Family | Admitting: Family

## 2017-10-04 ENCOUNTER — Encounter: Payer: Self-pay | Admitting: Family

## 2017-10-04 VITALS — BP 108/64 | HR 79 | Temp 97.9°F | Resp 16 | Ht 68.0 in | Wt 175.0 lb

## 2017-10-04 DIAGNOSIS — Z0000 Encounter for general adult medical examination without abnormal findings: Secondary | ICD-10-CM | POA: Insufficient documentation

## 2017-10-04 DIAGNOSIS — L0292 Furuncle, unspecified: Secondary | ICD-10-CM

## 2017-10-04 MED ORDER — MUPIROCIN 2 % EX OINT: 1.0000 "application " | TOPICAL_OINTMENT | Freq: Two times a day (BID) | CUTANEOUS | 1 refills | Status: DC

## 2017-10-04 NOTE — Patient Instructions (Addendum)
Fasting labs when can Referral to dermatology, OB/GYN  Health Maintenance, Female Adopting a healthy lifestyle and getting preventive care can go a long way to promote health and wellness. Talk with your health care provider about what schedule of regular examinations is right for you. This is a good chance for you to check in with your provider about disease prevention and staying healthy. In between checkups, there are plenty of things you can do on your own. Experts have done a lot of research about which lifestyle changes and preventive measures are most likely to keep you healthy. Ask your health care provider for more information. Weight and diet Eat a healthy diet  Be sure to include plenty of vegetables, fruits, low-fat dairy products, and lean protein.  Do not eat a lot of foods high in solid fats, added sugars, or salt.  Get regular exercise. This is one of the most important things you can do for your health. ? Most adults should exercise for at least 150 minutes each week. The exercise should increase your heart rate and make you sweat (moderate-intensity exercise). ? Most adults should also do strengthening exercises at least twice a week. This is in addition to the moderate-intensity exercise.  Maintain a healthy weight  Body mass index (BMI) is a measurement that can be used to identify possible weight problems. It estimates body fat based on height and weight. Your health care provider can help determine your BMI and help you achieve or maintain a healthy weight.  For females 29 years of age and older: ? A BMI below 18.5 is considered underweight. ? A BMI of 18.5 to 24.9 is normal. ? A BMI of 25 to 29.9 is considered overweight. ? A BMI of 30 and above is considered obese.  Watch levels of cholesterol and blood lipids  You should start having your blood tested for lipids and cholesterol at 29 years of age, then have this test every 5 years.  You may need to have your  cholesterol levels checked more often if: ? Your lipid or cholesterol levels are high. ? You are older than 29 years of age. ? You are at high risk for heart disease.  Cancer screening Lung Cancer  Lung cancer screening is recommended for adults 46-26 years old who are at high risk for lung cancer because of a history of smoking.  A yearly low-dose CT scan of the lungs is recommended for people who: ? Currently smoke. ? Have quit within the past 15 years. ? Have at least a 30-pack-year history of smoking. A pack year is smoking an average of one pack of cigarettes a day for 1 year.  Yearly screening should continue until it has been 15 years since you quit.  Yearly screening should stop if you develop a health problem that would prevent you from having lung cancer treatment.  Breast Cancer  Practice breast self-awareness. This means understanding how your breasts normally appear and feel.  It also means doing regular breast self-exams. Let your health care provider know about any changes, no matter how small.  If you are in your 20s or 30s, you should have a clinical breast exam (CBE) by a health care provider every 1-3 years as part of a regular health exam.  If you are 17 or older, have a CBE every year. Also consider having a breast X-ray (mammogram) every year.  If you have a family history of breast cancer, talk to your health care provider about  genetic screening.  If you are at high risk for breast cancer, talk to your health care provider about having an MRI and a mammogram every year.  Breast cancer gene (BRCA) assessment is recommended for women who have family members with BRCA-related cancers. BRCA-related cancers include: ? Breast. ? Ovarian. ? Tubal. ? Peritoneal cancers.  Results of the assessment will determine the need for genetic counseling and BRCA1 and BRCA2 testing.  Cervical Cancer Your health care provider may recommend that you be screened regularly  for cancer of the pelvic organs (ovaries, uterus, and vagina). This screening involves a pelvic examination, including checking for microscopic changes to the surface of your cervix (Pap test). You may be encouraged to have this screening done every 3 years, beginning at age 56.  For women ages 65-65, health care providers may recommend pelvic exams and Pap testing every 3 years, or they may recommend the Pap and pelvic exam, combined with testing for human papilloma virus (HPV), every 5 years. Some types of HPV increase your risk of cervical cancer. Testing for HPV may also be done on women of any age with unclear Pap test results.  Other health care providers may not recommend any screening for nonpregnant women who are considered low risk for pelvic cancer and who do not have symptoms. Ask your health care provider if a screening pelvic exam is right for you.  If you have had past treatment for cervical cancer or a condition that could lead to cancer, you need Pap tests and screening for cancer for at least 20 years after your treatment. If Pap tests have been discontinued, your risk factors (such as having a new sexual partner) need to be reassessed to determine if screening should resume. Some women have medical problems that increase the chance of getting cervical cancer. In these cases, your health care provider may recommend more frequent screening and Pap tests.  Colorectal Cancer  This type of cancer can be detected and often prevented.  Routine colorectal cancer screening usually begins at 29 years of age and continues through 29 years of age.  Your health care provider may recommend screening at an earlier age if you have risk factors for colon cancer.  Your health care provider may also recommend using home test kits to check for hidden blood in the stool.  A small camera at the end of a tube can be used to examine your colon directly (sigmoidoscopy or colonoscopy). This is done to  check for the earliest forms of colorectal cancer.  Routine screening usually begins at age 50.  Direct examination of the colon should be repeated every 5-10 years through 29 years of age. However, you may need to be screened more often if early forms of precancerous polyps or small growths are found.  Skin Cancer  Check your skin from head to toe regularly.  Tell your health care provider about any new moles or changes in moles, especially if there is a change in a mole's shape or color.  Also tell your health care provider if you have a mole that is larger than the size of a pencil eraser.  Always use sunscreen. Apply sunscreen liberally and repeatedly throughout the day.  Protect yourself by wearing long sleeves, pants, a wide-brimmed hat, and sunglasses whenever you are outside.  Heart disease, diabetes, and high blood pressure  High blood pressure causes heart disease and increases the risk of stroke. High blood pressure is more likely to develop in: ? People  People who have blood pressure in the high end of the normal range (130-139/85-89 mm Hg). ? People who are overweight or obese. ? People who are African American.  If you are 18-39 years of age, have your blood pressure checked every 3-5 years. If you are 40 years of age or older, have your blood pressure checked every year. You should have your blood pressure measured twice-once when you are at a hospital or clinic, and once when you are not at a hospital or clinic. Record the average of the two measurements. To check your blood pressure when you are not at a hospital or clinic, you can use: ? An automated blood pressure machine at a pharmacy. ? A home blood pressure monitor.  If you are between 55 years and 79 years old, ask your health care provider if you should take aspirin to prevent strokes.  Have regular diabetes screenings. This involves taking a blood sample to check your fasting blood sugar level. ? If you are at a  normal weight and have a low risk for diabetes, have this test once every three years after 29 years of age. ? If you are overweight and have a high risk for diabetes, consider being tested at a younger age or more often. Preventing infection Hepatitis B  If you have a higher risk for hepatitis B, you should be screened for this virus. You are considered at high risk for hepatitis B if: ? You were born in a country where hepatitis B is common. Ask your health care provider which countries are considered high risk. ? Your parents were born in a high-risk country, and you have not been immunized against hepatitis B (hepatitis B vaccine). ? You have HIV or AIDS. ? You use needles to inject street drugs. ? You live with someone who has hepatitis B. ? You have had sex with someone who has hepatitis B. ? You get hemodialysis treatment. ? You take certain medicines for conditions, including cancer, organ transplantation, and autoimmune conditions.  Hepatitis C  Blood testing is recommended for: ? Everyone born from 1945 through 1965. ? Anyone with known risk factors for hepatitis C.  Sexually transmitted infections (STIs)  You should be screened for sexually transmitted infections (STIs) including gonorrhea and chlamydia if: ? You are sexually active and are younger than 29 years of age. ? You are older than 29 years of age and your health care provider tells you that you are at risk for this type of infection. ? Your sexual activity has changed since you were last screened and you are at an increased risk for chlamydia or gonorrhea. Ask your health care provider if you are at risk.  If you do not have HIV, but are at risk, it may be recommended that you take a prescription medicine daily to prevent HIV infection. This is called pre-exposure prophylaxis (PrEP). You are considered at risk if: ? You are sexually active and do not regularly use condoms or know the HIV status of your  partner(s). ? You take drugs by injection. ? You are sexually active with a partner who has HIV.  Talk with your health care provider about whether you are at high risk of being infected with HIV. If you choose to begin PrEP, you should first be tested for HIV. You should then be tested every 3 months for as long as you are taking PrEP. Pregnancy  If you are premenopausal and you may become pregnant, ask your health   provider about preconception counseling.  If you may become pregnant, take 400 to 800 micrograms (mcg) of folic acid every day.  If you want to prevent pregnancy, talk to your health care provider about birth control (contraception). Osteoporosis and menopause  Osteoporosis is a disease in which the bones lose minerals and strength with aging. This can result in serious bone fractures. Your risk for osteoporosis can be identified using a bone density scan.  If you are 73 years of age or older, or if you are at risk for osteoporosis and fractures, ask your health care provider if you should be screened.  Ask your health care provider whether you should take a calcium or vitamin D supplement to lower your risk for osteoporosis.  Menopause may have certain physical symptoms and risks.  Hormone replacement therapy may reduce some of these symptoms and risks. Talk to your health care provider about whether hormone replacement therapy is right for you. Follow these instructions at home:  Schedule regular health, dental, and eye exams.  Stay current with your immunizations.  Do not use any tobacco products including cigarettes, chewing tobacco, or electronic cigarettes.  If you are pregnant, do not drink alcohol.  If you are breastfeeding, limit how much and how often you drink alcohol.  Limit alcohol intake to no more than 1 drink per day for nonpregnant women. One drink equals 12 ounces of beer, 5 ounces of wine, or 1 ounces of hard liquor.  Do not use street  drugs.  Do not share needles.  Ask your health care provider for help if you need support or information about quitting drugs.  Tell your health care provider if you often feel depressed.  Tell your health care provider if you have ever been abused or do not feel safe at home. This information is not intended to replace advice given to you by your health care provider. Make sure you discuss any questions you have with your health care provider. Document Released: 01/31/2011 Document Revised: 12/24/2015 Document Reviewed: 04/21/2015 Elsevier Interactive Patient Education  [employer].

## 2017-10-04 NOTE — Progress Notes (Signed)
Subjective:    Patient ID: Rachel Compton, female    DOB: 10-15-1990, 27 y.o.   MRN: [id number]  CC: Rachel Compton is a 27 y.o. female who presents today for physical exam.    HPI: Doing well, no complaints today. Does describe a history of "boils".  Recently had one on her left upper arm which is resolved with witch hazel.  She also notes she has a lesion on her left buttocks, slightly tender.  No exudate, fever.  She noticed this several months ago, it is unchanged.  Does not recall any injury.    Plans to get the Mirena out later this year.  No depression or anxiety. No si/hi      Colorectal Cancer Screening: No early family history Breast Cancer Screening: No early family history Cervical Cancer Screening: Due; no pelvic pain, or concern for STDs today. Bone Health screening/DEXA for 65+: No increased fracture risk. Defer screening at this time. Lung Cancer Screening: Doesn't have 30 year pack year history and age > 55 years. Declines flu vaccine       Tetanus - utd         Labs: Screening labs today. Exercise: Gets regular exercise.  Alcohol use: occasional  Smoking/tobacco use: nonsmoker.  Regular dental exams: utd Wears seat belt: Yes.  HISTORY:  Past Medical History:  Diagnosis Date  . Asthma   . Chicken pox     Past Surgical History:  Procedure Laterality Date  . NO PAST SURGERIES     Family History  Adopted: Yes      ALLERGIES: Penicillins  Current Outpatient Medications on File Prior to Visit  Medication Sig Dispense Refill  . fluticasone (FLONASE) 50 MCG/ACT nasal spray Place 2 sprays into both nostrils daily. 16 g 2  . levonorgestrel (MIRENA) 20 MCG/24HR IUD 1 each by Intrauterine route once.     No current facility-administered medications on file prior to visit.     Social History   Tobacco Use  . Smoking status: Never Smoker  . Smokeless tobacco: Never Used  Substance Use Topics  . Alcohol use: Yes    Alcohol/week: 3.0 oz   Types: 5 Standard drinks or equivalent per week  . Drug use: No    Review of Systems  Constitutional: Negative for chills, fever and unexpected weight change.  HENT: Negative for congestion.   Respiratory: Negative for cough.   Cardiovascular: Negative for chest pain, palpitations and leg swelling.  Gastrointestinal: Negative for nausea and vomiting.  Genitourinary: Negative for dysuria, vaginal discharge and vaginal pain.  Musculoskeletal: Negative for arthralgias and myalgias.  Skin: Negative for rash.  Neurological: Negative for headaches.  Hematological: Negative for adenopathy.  Psychiatric/Behavioral: Negative for confusion.      Objective:    BP 108/64 (BP Location: Left Arm, Patient Position: Sitting, Cuff Size: Normal)   Pulse 79   Temp 97.9 F (36.6 C) (Oral)   Resp 16   Ht 5\' 8"  (1.727 m)   Wt 175 lb (79.4 kg)   SpO2 97%   BMI 26.61 kg/m   BP Readings from Last 3 Encounters:  10/04/17 108/64  05/01/17 112/80  02/23/17 124/70   Wt Readings from Last 3 Encounters:  10/04/17 175 lb (79.4 kg)  05/01/17 173 lb (78.5 kg)  02/23/17 170 lb (77.1 kg)    Physical Exam  Constitutional: She appears well-developed and well-nourished.  Eyes: Conjunctivae are normal.  Neck: No thyroid mass and no thyromegaly present.  Cardiovascular: Normal rate, regular rhythm,  normal heart sounds and normal pulses.  Pulmonary/Chest: Effort normal and breath sounds normal. She has no wheezes. She has no rhonchi. She has no rales. Right breast exhibits no inverted nipple, no mass, no nipple discharge, no skin change and no tenderness. Left breast exhibits no inverted nipple, no mass, no nipple discharge, no skin change and no tenderness. Breasts are symmetrical.  No masses or asymmetry appreciated during CBE.  Genitourinary: Uterus is not enlarged, not fixed and not tender. Cervix exhibits no motion tenderness, no discharge and no friability. Right adnexum displays no mass, no tenderness  and no fullness. Left adnexum displays no mass, no tenderness and no fullness.  Genitourinary Comments: Pap performed. No CMT. Unable to appreciated ovaries. mirena string visualized  Lymphadenopathy:       Head (right side): No submental, no submandibular, no tonsillar, no preauricular, no posterior auricular and no occipital adenopathy present.       Head (left side): No submental, no submandibular, no tonsillar, no preauricular, no posterior auricular and no occipital adenopathy present.       Right cervical: No superficial cervical, no deep cervical and no posterior cervical adenopathy present.      Left cervical: No superficial cervical, no deep cervical and no posterior cervical adenopathy present.    She has no axillary adenopathy.       Right axillary: No pectoral and no lateral adenopathy present.       Left axillary: No pectoral and no lateral adenopathy present. Neurological: She is alert.  Skin: Skin is warm and dry.     hardened, hyperpigmented lesion noted left buttocks.  Approximately 2 cm.  No erythema, increased warmth, discharge.  Psychiatric: She has a normal mood and affect. Her speech is normal and behavior is normal. Thought content normal.  Vitals reviewed.      Assessment & Plan:   Problem List Items Addressed This Visit      Musculoskeletal and Integument   Boil    Given history, given Bactroban ointment and advised to use Dial soap.  Lesion left buttocks appears to be scar tissue, or perhaps chronic sebaceous cyst.  Refer to dermatology for further evaluation.      Relevant Medications   mupirocin ointment (BACTROBAN) 2 %   Other Relevant Orders   Ambulatory referral to Dermatology     Other   Annual physical exam - Primary    Clinical breast exam performed.  Pap Performed.  Encourage exercise.      Relevant Medications   mupirocin ointment (BACTROBAN) 2 %   Other Relevant Orders   Ambulatory referral to Obstetrics / Gynecology   Ambulatory  referral to Dermatology   Cytology - PAP   CBC with Differential/Platelet   Comprehensive metabolic panel   Hemoglobin A1c   Lipid panel   TSH   VITAMIN D 25 Hydroxy (Vit-D Deficiency, Fractures)       I have discontinued Rachel Compton's cyclobenzaprine. I am also having her start on mupirocin ointment. Additionally, I am having her maintain her fluticasone and levonorgestrel.   Meds ordered this encounter  Medications  . mupirocin ointment (BACTROBAN) 2 %    Sig: Apply 1 application topically 2 (two) times daily.    Dispense:  22 g    Refill:  1    Order Specific Question:   Supervising Provider    Answer:   Sherlene ShamsULLO, TERESA L [[id number]]    Return precautions given.   Risks, benefits, and alternatives of the medications and treatment plan  prescribed today were discussed, and patient expressed understanding.   Education regarding symptom management and diagnosis given to patient on AVS.   Continue to follow with [name], FNP for routine health maintenance.   Rachel Compton and I agreed with plan.   [name], FNP

## 2017-10-04 NOTE — Assessment & Plan Note (Addendum)
Clinical breast exam performed.  Pap Performed.  Encourage exercise.

## 2017-10-04 NOTE — Assessment & Plan Note (Signed)
Given history, given Bactroban ointment and advised to use Dial soap.  Lesion left buttocks appears to be scar tissue, or perhaps chronic sebaceous cyst.  Refer to dermatology for further evaluation.

## 2017-10-06 LAB — CYTOLOGY - PAP: DIAGNOSIS: NEGATIVE

## 2017-10-11 ENCOUNTER — Other Ambulatory Visit: Payer: BC Managed Care – PPO

## 2017-10-20 ENCOUNTER — Other Ambulatory Visit (INDEPENDENT_AMBULATORY_CARE_PROVIDER_SITE_OTHER): Payer: BC Managed Care – PPO

## 2017-10-20 DIAGNOSIS — Z0000 Encounter for general adult medical examination without abnormal findings: Secondary | ICD-10-CM

## 2017-10-20 LAB — COMPREHENSIVE METABOLIC PANEL
ALT: 12 U/L (ref 0–35)
AST: 13 U/L (ref 0–37)
Albumin: 4.4 g/dL (ref 3.5–5.2)
Alkaline Phosphatase: 47 U/L (ref 39–117)
BUN: 11 mg/dL (ref 6–23)
CHLORIDE: 106 meq/L (ref 96–112)
CO2: 26 meq/L (ref 19–32)
CREATININE: 0.69 mg/dL (ref 0.40–1.20)
Calcium: 9.5 mg/dL (ref 8.4–10.5)
GFR: 107.17 mL/min (ref >60.00)
Glucose, Bld: 80 mg/dL (ref 70–99)
Potassium: 4.6 mEq/L (ref 3.5–5.1)
SODIUM: 140 meq/L (ref 135–145)
Total Bilirubin: 0.4 mg/dL (ref 0.2–1.2)
Total Protein: 7.5 g/dL (ref 6.0–8.3)

## 2017-10-20 LAB — LIPID PANEL
CHOL/HDL RATIO: 3
Cholesterol: 162 mg/dL (ref 0–200)
HDL: 60.8 mg/dL (ref >39.00)
LDL Cholesterol: 93 mg/dL (ref 0–99)
NONHDL: 100.92
Triglycerides: 40 mg/dL (ref 0.0–149.0)
VLDL: 8 mg/dL (ref 0.0–40.0)

## 2017-10-20 LAB — CBC WITH DIFFERENTIAL/PLATELET
BASOS PCT: 0.4 % (ref 0.0–3.0)
Basophils Absolute: 0 10*3/uL (ref 0.0–0.1)
EOS ABS: 0.4 10*3/uL (ref 0.0–0.7)
Eosinophils Relative: 9.2 % — ABNORMAL HIGH (ref 0.0–5.0)
HEMATOCRIT: 39.2 % (ref 36.0–46.0)
Hemoglobin: 13.5 g/dL (ref 12.0–15.0)
LYMPHS ABS: 1.5 10*3/uL (ref 0.7–4.0)
LYMPHS PCT: 37.5 % (ref 12.0–46.0)
MCHC: 34.5 g/dL (ref 30.0–36.0)
MCV: 90.9 fl (ref 78.0–100.0)
MONO ABS: 0.3 10*3/uL (ref 0.1–1.0)
Monocytes Relative: 8.7 % (ref 3.0–12.0)
NEUTROS ABS: 1.7 10*3/uL (ref 1.4–7.7)
NEUTROS PCT: 44.2 % (ref 43.0–77.0)
Platelets: 289 10*3/uL (ref 150.0–400.0)
RBC: 4.31 Mil/uL (ref 3.87–5.11)
RDW: 13 % (ref 11.5–15.5)
WBC: 3.9 10*3/uL — ABNORMAL LOW (ref 4.0–10.5)

## 2017-10-20 LAB — TSH: TSH: 1.02 u[IU]/mL (ref 0.35–4.50)

## 2017-10-20 LAB — HEMOGLOBIN A1C: Hgb A1c MFr Bld: 5.3 % (ref 4.6–6.5)

## 2017-10-20 LAB — VITAMIN D 25 HYDROXY (VIT D DEFICIENCY, FRACTURES): VITD: 8.34 ng/mL — AB (ref 30.00–100.00)

## 2017-11-06 ENCOUNTER — Encounter: Payer: Self-pay | Admitting: Obstetrics and Gynecology

## 2017-11-06 ENCOUNTER — Ambulatory Visit (INDEPENDENT_AMBULATORY_CARE_PROVIDER_SITE_OTHER): Payer: BC Managed Care – PPO | Admitting: Obstetrics and Gynecology

## 2017-11-06 VITALS — BP 110/70 | HR 74 | Ht 68.0 in | Wt 175.0 lb

## 2017-11-06 DIAGNOSIS — Z30432 Encounter for removal of intrauterine contraceptive device: Secondary | ICD-10-CM

## 2017-11-06 DIAGNOSIS — Z30013 Encounter for initial prescription of injectable contraceptive: Secondary | ICD-10-CM | POA: Diagnosis not present

## 2017-11-06 MED ORDER — MEDROXYPROGESTERONE ACETATE 150 MG/ML IM SUSP: 150.0000 mg | INTRAMUSCULAR | 3 refills | Status: DC

## 2017-11-06 NOTE — Progress Notes (Signed)
  History of Present Illness:  Irish EldersBrandi Compton is a 27 y.o. that had a Mirena IUD placed approximately 5 years ago. Since that time, she states that she has not been happy with the IUD. She had cramping and pain, but minimal to no menstrual bleeding.  The following portions of the patient's history were reviewed and updated as appropriate: allergies, current medications, past family history, past medical history, past social history, past surgical history and problem list.  Patient Active Problem List   Diagnosis Date Noted  . Boil 10/04/2017  . Acute nonintractable headache 02/23/2017  . Bloating 02/23/2017  . Acute sinusitis 12/28/2016  . Annual physical exam 06/09/2016   Medications:  Current Outpatient Medications on File Prior to Visit  Medication Sig Dispense Refill  . fluticasone (FLONASE) 50 MCG/ACT nasal spray Place 2 sprays into both nostrils daily. 16 g 2  . levonorgestrel (MIRENA) 20 MCG/24HR IUD 1 each by Intrauterine route once.    . mupirocin ointment (BACTROBAN) 2 % Apply 1 application topically 2 (two) times daily. 22 g 1   No current facility-administered medications on file prior to visit.    Allergies: is allergic to penicillins.  Physical Exam:  BP 110/70   Pulse 74   Ht 5\' 8"  (1.727 m)   Wt 175 lb (79.4 kg)   BMI 26.61 kg/m  Body mass index is 26.61 kg/m. Constitutional: Well nourished, well developed female in no acute distress.  Abdomen: diffusely non tender to palpation, non distended, and no masses, hernias Neuro: Grossly intact Psych:  Normal mood and affect.    Pelvic exam:  Two IUD strings present seen coming from the cervical os. EGBUS, vaginal vault and cervix: within normal limits  IUD Removal Strings of IUD identified and grasped.  IUD removed without problem.  Pt tolerated this well.  IUD noted to be intact.  Assessment: IUD Removal  Plan: IUD removed and plan for contraception is Depo-Provera. She reports that she will return tomorrow  for Depo Injections. Emphasized that she should refrain from intercourse until she received Depo Provera and that she will need to use abstinence or back up birth control such as condoms for 1 week after starting Depo-Provera.  She was amenable to this plan.  Adelene Idlerhristanna Lisandro Meggett MD [facility], [facility] 11/06/2017 8:36 AM

## 2017-11-07 ENCOUNTER — Ambulatory Visit (INDEPENDENT_AMBULATORY_CARE_PROVIDER_SITE_OTHER): Payer: BC Managed Care – PPO

## 2017-11-07 DIAGNOSIS — Z3042 Encounter for surveillance of injectable contraceptive: Secondary | ICD-10-CM | POA: Diagnosis not present

## 2017-11-07 MED ORDER — MEDROXYPROGESTERONE ACETATE 150 MG/ML IM SUSP
150.0000 mg | Freq: Once | INTRAMUSCULAR | Status: AC
  Administered 2017-11-07: 150 mg via INTRAMUSCULAR

## 2017-11-15 ENCOUNTER — Ambulatory Visit: Payer: BC Managed Care – PPO | Admitting: Family Medicine

## 2017-11-15 ENCOUNTER — Other Ambulatory Visit: Payer: Self-pay

## 2017-11-15 ENCOUNTER — Encounter: Payer: Self-pay | Admitting: Family Medicine

## 2017-11-15 DIAGNOSIS — J309 Allergic rhinitis, unspecified: Secondary | ICD-10-CM | POA: Insufficient documentation

## 2017-11-15 DIAGNOSIS — M25571 Pain in right ankle and joints of right foot: Secondary | ICD-10-CM | POA: Insufficient documentation

## 2017-11-15 MED ORDER — MOMETASONE FUROATE 50 MCG/ACT NA SUSP: 2.0000 | Freq: Every day | NASAL | 12 refills | Status: DC

## 2017-11-15 NOTE — Progress Notes (Signed)
Rachel AlarEric Finas Delone, MD Phone: [phone]  Irish EldersBrandi Compton is a 27 y.o. female who presents today for same day visit.  Patient notes a week or so ago she missed a step and inverted her right ankle.  Since then she has had pain in her lateral aspect right ankle and lateral aspect right foot.  She walked on it immediately after this though does note she had been drinking so had not felt any of the pain.  The next day she noted it and was able to walk on it as well.  Hurts when she walks on her tiptoes.  Has not improved significantly.  Has tried lidocaine and wrapping it.  She reports postnasal drip, congestion, and sneezing consistent with prior allergies.  Worse this year than past years.  No eye issues or ear issues.  She has tried Claritin and Zyrtec as well as Flonase.  Social History   Tobacco Use  Smoking Status Never Smoker  Smokeless Tobacco Never Used     ROS see history of present illness  Objective  Physical Exam Vitals:   11/15/17 1509  BP: 112/86  Pulse: 77  Temp: 98.3 F (36.8 C)  SpO2: 98%    BP Readings from Last 3 Encounters:  11/15/17 112/86  11/06/17 110/70  10/04/17 108/64   Wt Readings from Last 3 Encounters:  11/15/17 175 lb 9.6 oz (79.7 kg)  11/06/17 175 lb (79.4 kg)  10/04/17 175 lb (79.4 kg)    Physical Exam  Constitutional: No distress.  HENT:  Head: Normocephalic and atraumatic.  Mouth/Throat: Oropharynx is clear and moist. No oropharyngeal exudate.  Normal TMs bilaterally  Eyes: Pupils are equal, round, and reactive to light. Conjunctivae are normal.  Cardiovascular: Normal rate and regular rhythm.  Pulmonary/Chest: Effort normal and breath sounds normal.  Musculoskeletal: She exhibits no edema.  Neurological: She is alert.  Lateral right foot and ankle swelling that is mild, there is tenderness of the soft tissues in the right lateral ankle as well as the lateral malleolus and fifth head of metatarsal, no medial malleoli or tenderness,  no navicular tenderness, 2+ DP pulses bilaterally, left foot and ankle with no swelling or tenderness  Skin: Skin is warm and dry. She is not diaphoretic.     Assessment/Plan: Please see individual problem list.  Right ankle pain Most likely ankle sprain though given the areas of tenderness over the bony aspects she warrants x-ray imaging of her ankle and foot to evaluate this further.  She deferred this today and opted to return tomorrow given her schedule and have this done at that time.  Orders have been placed.  I offered her a boot for this as well though she deferred that as well.  Allergic rhinitis Patient with allergy symptoms.  We will trial Nasonex as she has not responded to Flonase or Claritin or Zyrtec.  Orders Placed This Encounter  Procedures  . DG Foot Complete Right    Standing Status:   Future    Standing Expiration Date:   01/16/2019    Order Specific Question:   Reason for Exam (SYMPTOM  OR DIAGNOSIS REQUIRED)    Answer:   right foot pain after inversion injury, tender over 5th metatarsal head and later malleolus    Order Specific Question:   Is patient pregnant?    Answer:   No    Order Specific Question:   Preferred imaging location?    Answer:   [facility]    Order Specific Question:  Radiology Contrast Protocol - do NOT remove file path    Answer:   \\[email or web address]s.pdf  . DG Ankle Complete Right    Standing Status:   Future    Standing Expiration Date:   01/16/2019    Order Specific Question:   Reason for Exam (SYMPTOM  OR DIAGNOSIS REQUIRED)    Answer:   right foot pain after inversion injury, tender over 5th metatarsal head and later malleolus    Order Specific Question:   Is patient pregnant?    Answer:   No    Order Specific Question:   Preferred imaging location?    Answer:   [facility] Specific Question:   Radiology Contrast Protocol - do NOT remove file path    Answer:    \\[email or web address]\DXFluoroContrastProtocols.pdf    Meds ordered this encounter  Medications  . mometasone (NASONEX) 50 MCG/ACT nasal spray    Sig: Place 2 sprays into the nose daily.    Dispense:  17 g    Refill:  12     Rachel Alar, MD [facility]

## 2017-11-15 NOTE — Patient Instructions (Signed)
Nice to meet you. Please come back tomorrow for the x-ray of your right foot and ankle.  Please try the nasonex for your allergies.

## 2017-11-15 NOTE — Assessment & Plan Note (Signed)
Most likely ankle sprain though given the areas of tenderness over the bony aspects she warrants x-ray imaging of her ankle and foot to evaluate this further.  She deferred this today and opted to return tomorrow given her schedule and have this done at that time.  Orders have been placed.  I offered her a boot for this as well though she deferred that as well.

## 2017-11-15 NOTE — Assessment & Plan Note (Signed)
Patient with allergy symptoms.  We will trial Nasonex as she has not responded to Flonase or Claritin or Zyrtec.

## 2017-11-16 ENCOUNTER — Ambulatory Visit (INDEPENDENT_AMBULATORY_CARE_PROVIDER_SITE_OTHER): Payer: BC Managed Care – PPO

## 2017-11-16 DIAGNOSIS — M25571 Pain in right ankle and joints of right foot: Secondary | ICD-10-CM

## 2017-11-20 ENCOUNTER — Telehealth: Payer: Self-pay | Admitting: Family Medicine

## 2017-11-20 NOTE — Telephone Encounter (Signed)
Can you please contact the patient and see if she went to get evaluated at the [facility] for her foot fracture?  Thanks.

## 2017-11-21 NOTE — Telephone Encounter (Signed)
Left message to return cal, ok for pec to speak to patient to ask if she went to [facility] for the foot fracture

## 2017-11-30 NOTE — Telephone Encounter (Signed)
Patient did not return call.

## 2018-01-18 ENCOUNTER — Ambulatory Visit: Payer: BC Managed Care – PPO | Admitting: Internal Medicine

## 2018-01-18 ENCOUNTER — Encounter: Payer: Self-pay | Admitting: Internal Medicine

## 2018-01-18 VITALS — BP 108/76 | HR 72 | Temp 98.0°F | Ht 68.0 in | Wt 174.0 lb

## 2018-01-18 DIAGNOSIS — G43819 Other migraine, intractable, without status migrainosus: Secondary | ICD-10-CM | POA: Diagnosis not present

## 2018-01-18 DIAGNOSIS — R51 Headache: Secondary | ICD-10-CM

## 2018-01-18 DIAGNOSIS — J019 Acute sinusitis, unspecified: Secondary | ICD-10-CM | POA: Diagnosis not present

## 2018-01-18 DIAGNOSIS — R519 Headache, unspecified: Secondary | ICD-10-CM

## 2018-01-18 MED ORDER — ONDANSETRON HCL 4 MG PO TABS: 4.0000 mg | ORAL_TABLET | Freq: Two times a day (BID) | ORAL | 0 refills | Status: DC | PRN

## 2018-01-18 MED ORDER — BUTALBITAL-APAP-CAFFEINE 50-325-40 MG PO TABS: 1.0000 | ORAL_TABLET | Freq: Two times a day (BID) | ORAL | 1 refills | Status: AC | PRN

## 2018-01-18 MED ORDER — AZITHROMYCIN 250 MG PO TABS: ORAL_TABLET | ORAL | 0 refills | Status: DC

## 2018-01-18 NOTE — Progress Notes (Addendum)
Chief Complaint  Patient presents with  . Migraine   Acute visit  1. C/o left sided h/a since Monday and Tuesday with h/o migraines. Today she is not bothered by light/sound but few months ago she was. Migraines were more freq. In HS. She had MRI with and w/o contrast 04/22/08. She is sleeping 7-8 hours and not excessively drinking caffeine. She does report stress with work. She has tried aspirin, Tylenol and excedrin migraine w/o relief. She did start getting depo shots since 10/2017 and h/a freq reviewed today as possibly side effect 9-17%. She also reports allergies and is using nasacort spray and otc antihistamine she tried Sunday. Pain is 5/10 today. She had eye exam 6 months ago and has seen the dentist recently as well. She reports at times she does get nauseated and vomits. She feels like currently there is a lot of pressure behind left eye.     Review of Systems  Constitutional: Negative for fever.  HENT: Positive for sinus pain. Negative for hearing loss.   Eyes: Negative for blurred vision.  Gastrointestinal: Positive for nausea. Negative for vomiting.  Neurological: Positive for headaches.  Psychiatric/Behavioral:       +stress with work    Past Medical History:  Diagnosis Date  . Asthma   . Chicken pox   . Migraine    04/22/08 MRI normal    Past Surgical History:  Procedure Laterality Date  . NO PAST SURGERIES     Family History  Adopted: Yes  Problem Relation Age of Onset  . Cancer Neg Hx   . Diabetes Neg Hx   . Hyperlipidemia Neg Hx   . Hypertension Neg Hx   . Thyroid disease Neg Hx    Social History   Socioeconomic History  . Marital status: Single    Spouse name: Not on file  . Number of children: Not on file  . Years of education: Not on file  . Highest education level: Not on file  Occupational History  . Not on file  Social Needs  . Financial resource strain: Not on file  . Food insecurity:    Worry: Not on file    Inability: Not on file  .  Transportation needs:    Medical: Not on file    Non-medical: Not on file  Tobacco Use  . Smoking status: Never Smoker  . Smokeless tobacco: Never Used  Substance and Sexual Activity  . Alcohol use: Yes    Alcohol/week: 3.0 oz    Types: 5 Standard drinks or equivalent per week  . Drug use: No  . Sexual activity: Yes    Partners: Male    Birth control/protection: IUD  Lifestyle  . Physical activity:    Days per week: 3 days    Minutes per session: 40 min  . Stress: Not at all  Relationships  . Social connections:    Talks on phone: Not on file    Gets together: Not on file    Attends religious service: Not on file    Active member of club or organization: Not on file    Attends meetings of clubs or organizations: Not on file    Relationship status: Not on file  . Intimate partner violence:    Fear of current or ex partner: Not on file    Emotionally abused: Not on file    Physically abused: Not on file    Forced sexual activity: Not on file  Other Topics Concern  . 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 medroxyPROGESTERone (DEPO-PROVERA) 150 MG/ML injection Inject 1 mL (150 mg total) into the muscle every 3 (three) months.  . mometasone (NASONEX) 50 MCG/ACT nasal spray Place 2 sprays into the nose daily.   Allergies  Allergen Reactions  . Penicillins    No results found for this or any previous visit (from the past 2160 hour(s)). Objective  Body mass index is 26.46 kg/m. Wt Readings from Last 3 Encounters:  01/18/18 174 lb (78.9 kg)  11/15/17 175 lb 9.6 oz (79.7 kg)  11/06/17 175 lb (79.4 kg)   Temp Readings from Last 3 Encounters:  01/18/18 98 F (36.7 C) (Oral)  11/15/17 98.3 F (36.8 C)  10/04/17 97.9 F (36.6 C) (Oral)   BP Readings from Last 3 Encounters:  01/18/18 108/76  11/15/17 112/86  11/06/17 110/70   Pulse Readings from Last 3 Encounters:   01/18/18 72  11/15/17 77  11/06/17 74    Physical Exam  Constitutional: She is oriented to person, place, and time. Vital signs are normal. She appears well-developed and well-nourished. She is cooperative.  HENT:  Head: Normocephalic and atraumatic.  Nose: Right sinus exhibits no maxillary sinus tenderness and no frontal sinus tenderness. Left sinus exhibits maxillary sinus tenderness and frontal sinus tenderness.  Mouth/Throat: Oropharynx is clear and moist and mucous membranes are normal.  +ethmoid sinus ttp worse frontal   Eyes: Pupils are equal, round, and reactive to light. Conjunctivae are normal.  Cardiovascular: Normal rate, regular rhythm and normal heart sounds.  Pulmonary/Chest: Effort normal and breath sounds normal.  Neurological: She is alert and oriented to person, place, and time. Gait normal.  CN 2-12 grossly intact moving all 4 ext normally no sensory deficits    Skin: Skin is warm, dry and intact.  Psychiatric: She has a normal mood and affect. Her speech is normal and behavior is normal. Judgment and thought content normal. Cognition and memory are normal.  Nursing note and vitals reviewed.   Assessment   1. Left sided h/a ddx migraine vs tension 2/2 stressors vs sinusitis vs related to depo-provera Plan   1. Hold excedrine Trial of fioricet prn  If this does not help try otc nsaids Prn zofran for nausea Trial of zpack to tx for sinusitis, cont AH prn and nasacort Pt declines steroid/toradol injection today   If h/a continues disc h/a and wellness center in [place] and MRI brain    Provider: Dr. French Anaracy McLean-Scocuzza-[facility]

## 2018-01-18 NOTE — Patient Instructions (Addendum)
Hold Excedrin  Try Fiorocet and Zpack for now I hope this helps  Take care  Fu in 2 weeks with PCP  [facility] [place] [facility] think about it     Sinusitis, Adult Sinusitis is soreness and inflammation of your sinuses. Sinuses are hollow spaces in the bones around your face. Your sinuses are located:  Around your eyes.  In the middle of your forehead.  Behind your nose.  In your cheekbones.  Your sinuses and nasal passages are lined with a stringy fluid (mucus). Mucus normally drains out of your sinuses. When your nasal tissues become inflamed or swollen, the mucus can become trapped or blocked so air cannot flow through your sinuses. This allows bacteria, viruses, and funguses to grow, which leads to infection. Sinusitis can develop quickly and last for 7?10 days (acute) or for more than 12 weeks (chronic). Sinusitis often develops after a cold. What are the causes? This condition is caused by anything that creates swelling in the sinuses or stops mucus from draining, including:  Allergies.  Asthma.  Bacterial or viral infection.  Abnormally shaped bones between the nasal passages.  Nasal growths that contain mucus (nasal polyps).  Narrow sinus openings.  Pollutants, such as chemicals or irritants in the air.  A foreign object stuck in the nose.  A fungal infection. This is rare.  What increases the risk? The following factors may make you more likely to develop this condition:  Having allergies or asthma.  Having had a recent cold or respiratory tract infection.  Having structural deformities or blockages in your nose or sinuses.  Having a weak immune system.  Doing a lot of swimming or diving.  Overusing nasal sprays.  Smoking.  What are the signs or symptoms? The main symptoms of this condition are pain and a feeling of pressure around the affected sinuses. Other symptoms include:  Upper  toothache.  Earache.  Headache.  Bad breath.  Decreased sense of smell and taste.  A cough that may get worse at night.  Fatigue.  Fever.  Thick drainage from your nose. The drainage is often green and it may contain pus (purulent).  Stuffy nose or congestion.  Postnasal drip. This is when extra mucus collects in the throat or back of the nose.  Swelling and warmth over the affected sinuses.  Sore throat.  Sensitivity to light.  How is this diagnosed? This condition is diagnosed based on symptoms, a medical history, and a physical exam. To find out if your condition is acute or chronic, your health care provider may:  Look in your nose for signs of nasal polyps.  Tap over the affected sinus to check for signs of infection.  View the inside of your sinuses using an imaging device that has a light attached (endoscope).  If your health care provider suspects that you have chronic sinusitis, you may also:  Be tested for allergies.  Have a sample of mucus taken from your nose (nasal culture) and checked for bacteria.  Have a mucus sample examined to see if your sinusitis is related to an allergy.  If your sinusitis does not respond to treatment and it lasts longer than 8 weeks, you may have an MRI or CT scan to check your sinuses. These scans also help to determine how severe your infection is. In rare cases, a bone biopsy may be done to rule out more serious types of fungal sinus disease. How is this treated? Treatment for sinusitis  depends on the cause and whether your condition is chronic or acute. If a virus is causing your sinusitis, your symptoms will go away on their own within 10 days. You may be given medicines to relieve your symptoms, including:  Topical nasal decongestants. They shrink swollen nasal passages and let mucus drain from your sinuses.  Antihistamines. These drugs block inflammation that is triggered by allergies. This can help to ease swelling in  your nose and sinuses.  Topical nasal corticosteroids. These are nasal sprays that ease inflammation and swelling in your nose and sinuses.  Nasal saline washes. These rinses can help to get rid of thick mucus in your nose.  If your condition is caused by bacteria, you will be given an antibiotic medicine. If your condition is caused by a fungus, you will be given an antifungal medicine. Surgery may be needed to correct underlying conditions, such as narrow nasal passages. Surgery may also be needed to remove polyps. Follow these instructions at home: Medicines  Take, use, or apply over-the-counter and prescription medicines only as told by your health care provider. These may include nasal sprays.  If you were prescribed an antibiotic medicine, take it as told by your health care provider. Do not stop taking the antibiotic even if you start to feel better. Hydrate and Humidify  Drink enough water to keep your urine clear or pale yellow. Staying hydrated will help to thin your mucus.  Use a cool mist humidifier to keep the humidity level in your home above 50%.  Inhale steam for 10-15 minutes, 3-4 times a day or as told by your health care provider. You can do this in the bathroom while a hot shower is running.  Limit your exposure to cool or dry air. Rest  Rest as much as possible.  Sleep with your head raised (elevated).  Make sure to get enough sleep each night. General instructions  Apply a warm, moist washcloth to your face 3-4 times a day or as told by your health care provider. This will help with discomfort.  Wash your hands often with soap and water to reduce your exposure to viruses and other germs. If soap and water are not available, use hand sanitizer.  Do not smoke. Avoid being around people who are smoking (secondhand smoke).  Keep all follow-up visits as told by your health care provider. This is important. Contact a health care provider if:  You have a  fever.  Your symptoms get worse.  Your symptoms do not improve within 10 days. Get help right away if:  You have a severe headache.  You have persistent vomiting.  You have pain or swelling around your face or eyes.  You have vision problems.  You develop confusion.  Your neck is stiff.  You have trouble breathing. This information is not intended to replace advice given to you by your health care provider. Make sure you discuss any questions you have with your health care provider. Document Released: 07/18/2005 Document Revised: 03/13/2016 Document Reviewed: 05/13/2015 Elsevier Interactive Patient Education  2018 [employer]  Migraine Headache A migraine headache is an intense, throbbing pain on one side or both sides of the head. Migraines may also cause other symptoms, such as nausea, vomiting, and sensitivity to light and noise. What are the causes? Doing or taking certain things may also trigger migraines, such as:  Alcohol.  Smoking.  Medicines, such as: ? Medicine used to treat chest pain (nitroglycerine). ? Birth control pills. ? Estrogen  pills. ? Certain blood pressure medicines.  Aged cheeses, chocolate, or caffeine.  Foods or drinks that contain nitrates, glutamate, aspartame, or tyramine.  Physical activity.  Other things that may trigger a migraine include:  Menstruation.  Pregnancy.  Hunger.  Stress, lack of sleep, too much sleep, or fatigue.  Weather changes.  What increases the risk? The following factors may make you more likely to experience migraine headaches:  Age. Risk increases with age.  Family history of migraine headaches.  Being Caucasian.  Depression and anxiety.  Obesity.  Being a woman.  Having a hole in the heart (patent foramen ovale) or other heart problems.  What are the signs or symptoms? The main symptom of this condition is pulsating or throbbing pain. Pain may:  Happen in any area of the head, such as  on one side or both sides.  Interfere with daily activities.  Get worse with physical activity.  Get worse with exposure to bright lights or loud noises.  Other symptoms may include:  Nausea.  Vomiting.  Dizziness.  General sensitivity to bright lights, loud noises, or smells.  Before you get a migraine, you may get warning signs that a migraine is developing (aura). An aura may include:  Seeing flashing lights or having blind spots.  Seeing bright spots, halos, or zigzag lines.  Having tunnel vision or blurred vision.  Having numbness or a tingling feeling.  Having trouble talking.  Having muscle weakness.  How is this diagnosed? A migraine headache can be diagnosed based on:  Your symptoms.  A physical exam.  Tests, such as CT scan or MRI of the head. These imaging tests can help rule out other causes of headaches.  Taking fluid from the spine (lumbar puncture) and analyzing it (cerebrospinal fluid analysis, or CSF analysis).  How is this treated? A migraine headache is usually treated with medicines that:  Relieve pain.  Relieve nausea.  Prevent migraines from coming back.  Treatment may also include:  Acupuncture.  Lifestyle changes like avoiding foods that trigger migraines.  Follow these instructions at home: Medicines  Take over-the-counter and prescription medicines only as told by your health care provider.  Do not drive or use heavy machinery while taking prescription pain medicine.  To prevent or treat constipation while you are taking prescription pain medicine, your health care provider may recommend that you: ? Drink enough fluid to keep your urine clear or pale yellow. ? Take over-the-counter or prescription medicines. ? Eat foods that are high in fiber, such as fresh fruits and vegetables, whole grains, and beans. ? Limit foods that are high in fat and processed sugars, such as fried and sweet foods. Lifestyle  Avoid alcohol  use.  Do not use any products that contain nicotine or tobacco, such as cigarettes and e-cigarettes. If you need help quitting, ask your health care provider.  Get at least 8 hours of sleep every night.  Limit your stress. General instructions   Keep a journal to find out what may trigger your migraine headaches. For example, write down: ? What you eat and drink. ? How much sleep you get. ? Any change to your diet or medicines.  If you have a migraine: ? Avoid things that make your symptoms worse, such as bright lights. ? It may help to lie down in a dark, quiet room. ? Do not drive or use heavy machinery. ? Ask your health care provider what activities are safe for you while you are experiencing symptoms.  Keep all follow-up visits as told by your health care provider. This is important. Contact a health care provider if:  You develop symptoms that are different or more severe than your usual migraine symptoms. Get help right away if:  Your migraine becomes severe.  You have a fever.  You have a stiff neck.  You have vision loss.  Your muscles feel weak or like you cannot control them.  You start to lose your balance often.  You develop trouble walking.  You faint. This information is not intended to replace advice given to you by your health care provider. Make sure you discuss any questions you have with your health care provider. Document Released: 07/18/2005 Document Revised: 02/05/2016 Document Reviewed: 01/04/2016 Elsevier Interactive Patient Education  2017 [employer].

## 2018-01-18 NOTE — Progress Notes (Signed)
Pre visit review using our clinic review tool, if applicable. No additional management support is needed unless otherwise documented below in the visit note. 

## 2018-02-05 ENCOUNTER — Ambulatory Visit (INDEPENDENT_AMBULATORY_CARE_PROVIDER_SITE_OTHER): Payer: BC Managed Care – PPO

## 2018-02-05 DIAGNOSIS — Z3042 Encounter for surveillance of injectable contraceptive: Secondary | ICD-10-CM

## 2018-02-05 MED ORDER — MEDROXYPROGESTERONE ACETATE 150 MG/ML IM SUSP
150.0000 mg | Freq: Once | INTRAMUSCULAR | Status: AC
  Administered 2018-02-05: 150 mg via INTRAMUSCULAR

## 2018-02-09 ENCOUNTER — Encounter: Payer: Self-pay | Admitting: Family

## 2018-02-09 ENCOUNTER — Ambulatory Visit: Payer: BC Managed Care – PPO | Admitting: Family

## 2018-02-09 VITALS — BP 114/82 | HR 80 | Temp 98.6°F | Resp 16 | Wt 172.8 lb

## 2018-02-09 DIAGNOSIS — E049 Nontoxic goiter, unspecified: Secondary | ICD-10-CM | POA: Insufficient documentation

## 2018-02-09 DIAGNOSIS — R519 Headache, unspecified: Secondary | ICD-10-CM

## 2018-02-09 DIAGNOSIS — R5383 Other fatigue: Secondary | ICD-10-CM | POA: Insufficient documentation

## 2018-02-09 DIAGNOSIS — R51 Headache: Secondary | ICD-10-CM

## 2018-02-09 MED ORDER — CHOLECALCIFEROL 1.25 MG (50000 UT) PO TABS: ORAL_TABLET | ORAL | 0 refills | Status: DC

## 2018-02-09 NOTE — Assessment & Plan Note (Addendum)
Reassured by normal neurologic exam today.  She is due for eye exam, in the setting of headache being predominantly left-sided, I  emphasized the importance of this.  She verbalized understanding.  Also pleased that patient's headache frequency has decreased to approximately 2 days in the past week.  It Is also responsive to Fioricet.  Will maintain on current regimen at this time however if headache frequency increases, patient will let me know and we will likely pursue imaging, or perhaps a daily preventative medication OR abortive therapy such as Imitrex.  Patient to follow-up in 2 months.

## 2018-02-09 NOTE — Patient Instructions (Addendum)
Please make eye exam prior to our next appointment  Lab appointment and follow up appointment 2 months. We will see if vitamin D has increased and perhaps energy  Today we discussed referrals, orders. Ultrasound of thyroid, sleep study   I have placed these orders in the system for you.  Please be sure to give us a call if you have not heard from our office regarding scheduling a test or regarding referral in a timely manner.  It is very important that you let me know as soon as possible.    EXERCISE. This is important for to increase energy.  Let me know if headaches were to worsen in any way.

## 2018-02-09 NOTE — Assessment & Plan Note (Signed)
Incidental finding.  Patient appears to be asymptomatic.  Pending ultrasound

## 2018-02-09 NOTE — Progress Notes (Signed)
Subjective:    Patient ID: Rachel Compton, female    DOB: 1991/04/01, 27 y.o.   MRN: [id number]  CC: Rachel Compton is a 27 y.o. female who presents today for follow up.   HPI: Follow up for HA  Intermittent left side HA x 2 weeks, improved.Seen two weeks ago for HA, sinusitis. Treated with azithromycin, prn fiorcet  Some days does not have a HA. Over past week, has had 2 headaches which responded to fioricet ( has taken approx twice).   Describes as pressure behind left eye. At last OV, left ear pressure however this has resolved.   Noted that prior tyelonol or excredrin would relieve HA. Has stopped taking these medications 2 weeks ago.   Notes work was stressful and more fatigue for months, unchanged. No exercise. Getting good sleep. Wakes up feeling un-refreshed. Husband states she snores.   No fever, vision changes, dizzines.   No h/o thyroid disease.  No trouble swallowing, hoarseness, unusual weight changes, skin changes, palpitations   On depo provera. No menses.   Last eye exam - 6 months ago     HISTORY:  Past Medical History:  Diagnosis Date  . Asthma   . Chicken pox   . Migraine    04/22/08 MRI normal    Past Surgical History:  Procedure Laterality Date  . NO PAST SURGERIES     Family History  Adopted: Yes  Problem Relation Age of Onset  . Cancer Neg Hx   . Diabetes Neg Hx   . Hyperlipidemia Neg Hx   . Hypertension Neg Hx   . Thyroid disease Neg Hx     Allergies: Penicillins Current Outpatient Medications on File Prior to Visit  Medication Sig Dispense Refill  . butalbital-acetaminophen-caffeine (FIORICET, ESGIC) 50-325-40 MG tablet Take 1-2 tablets by mouth 2 (two) times daily as needed for headache. 30 tablet 1  . mometasone (NASONEX) 50 MCG/ACT nasal spray Place 2 sprays into the nose daily. 17 g 12  . ondansetron (ZOFRAN) 4 MG tablet Take 1 tablet (4 mg total) by mouth 2 (two) times daily as needed for nausea or vomiting. 30 tablet 0  .  medroxyPROGESTERone (DEPO-PROVERA) 150 MG/ML injection Inject 1 mL (150 mg total) into the muscle every 3 (three) months. 1 mL 3   No current facility-administered medications on file prior to visit.     Social History   Tobacco Use  . Smoking status: Never Smoker  . Smokeless tobacco: Never Used  Substance Use Topics  . Alcohol use: Yes    Alcohol/week: 3.0 oz    Types: 5 Standard drinks or equivalent per week  . Drug use: No    Review of Systems  Constitutional: Negative for chills and fever.  HENT: Negative for congestion, trouble swallowing and voice change.   Eyes: Negative for photophobia, pain, discharge and visual disturbance.  Respiratory: Negative for cough, shortness of breath and wheezing.   Cardiovascular: Negative for chest pain and palpitations.  Gastrointestinal: Negative for nausea and vomiting.  Endocrine: Negative for cold intolerance and heat intolerance.  Neurological: Positive for headaches. Negative for dizziness.      Objective:    BP 114/82 (BP Location: Left Arm, Patient Position: Sitting, Cuff Size: Normal)   Pulse 80   Temp 98.6 F (37 C) (Oral)   Resp 16   Wt 172 lb 12 oz (78.4 kg)   SpO2 97%   BMI 26.27 kg/m  BP Readings from Last 3 Encounters:  02/09/18 114/82  01/18/18  108/76  11/15/17 112/86   Wt Readings from Last 3 Encounters:  02/09/18 172 lb 12 oz (78.4 kg)  01/18/18 174 lb (78.9 kg)  11/15/17 175 lb 9.6 oz (79.7 kg)    Physical Exam  Constitutional: She appears well-developed and well-nourished.  HENT:  Head: Normocephalic and atraumatic.  Right Ear: Hearing, tympanic membrane, external ear and ear canal normal. No swelling or tenderness. Tympanic membrane is not erythematous and not bulging. No middle ear effusion.  Left Ear: Tympanic membrane, external ear and ear canal normal. No swelling or tenderness. Tympanic membrane is not erythematous and not bulging.  No middle ear effusion.  Nose: Nose normal. No rhinorrhea.  Right sinus exhibits no maxillary sinus tenderness and no frontal sinus tenderness. Left sinus exhibits no maxillary sinus tenderness and no frontal sinus tenderness.  Mouth/Throat: Uvula is midline, oropharynx is clear and moist and mucous membranes are normal. No posterior oropharyngeal edema or posterior oropharyngeal erythema.  Eyes: Pupils are equal, round, and reactive to light. Conjunctivae, EOM and lids are normal. Lids are everted and swept, no foreign bodies found.  Normal fundus bilaterally   Neck: Thyromegaly present. No thyroid mass present.  Thyroid symmetrically enlarged.   Cardiovascular: Normal rate, regular rhythm, normal heart sounds and normal pulses.  Pulmonary/Chest: Effort normal and breath sounds normal. She has no wheezes. She has no rhonchi. She has no rales.  Lymphadenopathy:       Head (right side): No submental, no submandibular, no tonsillar, no preauricular, no posterior auricular and no occipital adenopathy present.       Head (left side): No submental, no submandibular, no tonsillar, no preauricular, no posterior auricular and no occipital adenopathy present.    She has no cervical adenopathy.       Right cervical: No superficial cervical, no deep cervical and no posterior cervical adenopathy present.      Left cervical: No superficial cervical, no deep cervical and no posterior cervical adenopathy present.  Neurological: She is alert. She has normal strength. No cranial nerve deficit or sensory deficit. She displays a negative Romberg sign.  Reflex Scores:      Bicep reflexes are 2+ on the right side and 2+ on the left side.      Patellar reflexes are 2+ on the right side and 2+ on the left side. Grip equal and strong bilateral upper extremities. Gait strong and steady. Able to perform  finger-to-nose without difficulty.   Skin: Skin is warm and dry.  Psychiatric: She has a normal mood and affect. Her speech is normal and behavior is normal. Thought content  normal.  Vitals reviewed.      Assessment & Plan:   Problem List Items Addressed This Visit      Endocrine   Enlarged thyroid    Incidental finding.  Patient appears to be asymptomatic.  Pending ultrasound      Relevant Orders   US THYROID     Other   Acute nonintractable headache - Primary    Reassured by normal neurologic exam today.  She is due for eye exam, in the setting of headache being predominantly left-sided, I  emphasized the importance of this.  She verbalized understanding.  Also pleased that patient's headache frequency has decreased to approximately 2 days in the past week.  It Is also responsive to Fioricet.  Will maintain on current regimen at this time however if headache frequency increases, patient will let me know and we will likely pursue imaging, or perhaps a  daily preventative medication OR abortive therapy such as Imitrex.  Patient to follow-up in 2 months.      Relevant Medications   Cholecalciferol 50000 units TABS   Other Relevant Orders   Ambulatory referral to Sleep Studies   Other fatigue    Chronic, unchanged.  Discussed with patient suspect multifactorial. No exercise.  However have some suspicion with headaches, nonrestorative sleep, snoring that sleep apnea may be contributory.  Pending sleep study. Also discussed that her vitamin D was significantly low and she did not start supplementation a few months ago.  Pending vitamin D megadose and we will recheck this in a couple months with folate, B12, CBC to ensure all normal and noncontributory.  Will follow      Relevant Medications   Cholecalciferol 50000 units TABS   Other Relevant Orders   B12 and Folate Panel   CBC with Differential/Platelet   VITAMIN D 25 Hydroxy (Vit-D Deficiency, Fractures)   Ambulatory referral to Sleep Studies       I have discontinued Lynia Compton's azithromycin. I am also having her start on Cholecalciferol. Additionally, I am having her maintain her  medroxyPROGESTERone, mometasone, ondansetron, and butalbital-acetaminophen-caffeine.   Meds ordered this encounter  Medications  . Cholecalciferol 50000 units TABS    Sig: 50,000 units PO qwk for 8 weeks.    Dispense:  8 tablet    Refill:  0    Order Specific Question:   Supervising Provider    Answer:   Sherlene Shams [[id number]]    Return precautions given.   Risks, benefits, and alternatives of the medications and treatment plan prescribed today were discussed, and patient expressed understanding.   Education regarding symptom management and diagnosis given to patient on AVS.  Continue to follow with [name], FNP for routine health maintenance.   Rachel Elders and I agreed with plan.   [name], FNP

## 2018-02-09 NOTE — Assessment & Plan Note (Signed)
Chronic, unchanged.  Discussed with patient suspect multifactorial. No exercise.  However have some suspicion with headaches, nonrestorative sleep, snoring that sleep apnea may be contributory.  Pending sleep study. Also discussed that her vitamin D was significantly low and she did not start supplementation a few months ago.  Pending vitamin D megadose and we will recheck this in a couple months with folate, B12, CBC to ensure all normal and noncontributory.  Will follow

## 2018-02-13 ENCOUNTER — Encounter (INDEPENDENT_AMBULATORY_CARE_PROVIDER_SITE_OTHER): Payer: Self-pay

## 2018-02-19 ENCOUNTER — Ambulatory Visit
Admission: RE | Admit: 2018-02-19 | Discharge: 2018-02-19 | Disposition: A | Discharge status: Home / Self Care | Payer: BC Managed Care – PPO | Source: Ambulatory Visit | Attending: Family | Admitting: Family

## 2018-02-19 DIAGNOSIS — E049 Nontoxic goiter, unspecified: Secondary | ICD-10-CM | POA: Diagnosis not present

## 2018-04-11 ENCOUNTER — Ambulatory Visit: Payer: BC Managed Care – PPO | Admitting: Family

## 2018-04-11 ENCOUNTER — Encounter: Payer: Self-pay | Admitting: Family

## 2018-04-11 VITALS — BP 110/80 | HR 66 | Temp 98.4°F | Resp 16 | Ht 68.0 in | Wt 174.5 lb

## 2018-04-11 DIAGNOSIS — L0292 Furuncle, unspecified: Secondary | ICD-10-CM | POA: Diagnosis not present

## 2018-04-11 MED ORDER — DOXYCYCLINE HYCLATE 100 MG PO TABS: 100.0000 mg | ORAL_TABLET | Freq: Two times a day (BID) | ORAL | 0 refills | Status: DC

## 2018-04-11 NOTE — Progress Notes (Signed)
Subjective:    Patient ID: Rachel Compton, female    DOB: 27-Jul-1991, 27 y.o.   MRN: [id number]  CC: Rachel Compton is a 27 y.o. female who presents today for an acute visit.    HPI: CC: right arm bump, localized swelling x 10 days, improved over 2 days ago. Noted mosquito bites at that time. NO spider or tick bite.   improvement with warm compresses. Tried to 'poke it' and only got clear- blood out of it. Tender.   Otherwise feels well. No fever, chills, arthralgias.   H/o boild. No known h/o mrsa   Works as Engineer, drilling     HISTORY:  Past Medical History:  Diagnosis Date  . Asthma   . Chicken pox   . Migraine    04/22/08 MRI normal    Past Surgical History:  Procedure Laterality Date  . NO PAST SURGERIES     Family History  Adopted: Yes  Problem Relation Age of Onset  . Cancer Neg Hx   . Diabetes Neg Hx   . Hyperlipidemia Neg Hx   . Hypertension Neg Hx   . Thyroid disease Neg Hx     Allergies: Penicillins Current Outpatient Medications on File Prior to Visit  Medication Sig Dispense Refill  . butalbital-acetaminophen-caffeine (FIORICET, ESGIC) 50-325-40 MG tablet Take 1-2 tablets by mouth 2 (two) times daily as needed for headache. 30 tablet 1  . Cholecalciferol 50000 units TABS 50,000 units PO qwk for 8 weeks. 8 tablet 0  . mometasone (NASONEX) 50 MCG/ACT nasal spray Place 2 sprays into the nose daily. 17 g 12  . ondansetron (ZOFRAN) 4 MG tablet Take 1 tablet (4 mg total) by mouth 2 (two) times daily as needed for nausea or vomiting. 30 tablet 0  . medroxyPROGESTERone (DEPO-PROVERA) 150 MG/ML injection Inject 1 mL (150 mg total) into the muscle every 3 (three) months. 1 mL 3   No current facility-administered medications on file prior to visit.     Social History   Tobacco Use  . Smoking status: Never Smoker  . Smokeless tobacco: Never Used  Substance Use Topics  . Alcohol use: Yes    Alcohol/week: 5.0 standard drinks    Types: 5 Standard drinks  or equivalent per week  . Drug use: No    Review of Systems  Constitutional: Negative for chills and fever.  Respiratory: Negative for cough.   Cardiovascular: Negative for chest pain and palpitations.  Gastrointestinal: Negative for nausea and vomiting.  Musculoskeletal: Negative for arthralgias and joint swelling.  Skin: Positive for color change and wound. Negative for rash.      Objective:    BP 110/80 (BP Location: Left Arm, Patient Position: Sitting, Cuff Size: Normal)   Pulse 66   Temp 98.4 F (36.9 C) (Oral)   Resp 16   Ht 5\' 8"  (1.727 m)   Wt 174 lb 8 oz (79.2 kg)   SpO2 98%   BMI 26.53 kg/m    Physical Exam  Constitutional: She appears well-developed and well-nourished.  Eyes: Conjunctivae are normal.  Cardiovascular: Normal rate, regular rhythm, normal heart sounds and normal pulses.  Pulmonary/Chest: Effort normal and breath sounds normal. She has no wheezes. She has no rhonchi. She has no rales.  Neurological: She is alert.  Skin: Skin is warm and dry.     Mildly tender, erythematous area approximately 4 cm in diameter noted in the right ventral side of forearm. Localized swelling. Skin intact. No scab or puncture identified.  Nonfluctuant.  No streaking, purulent discharge noted.  Slight increase in warmth when compared to left arm.  Able to extend, flex right arm with out pain.   Psychiatric: She has a normal mood and affect. Her speech is normal and behavior is normal. Thought content normal.  Vitals reviewed.      Assessment & Plan:   1. Boil Patient well-appearing, she is afebrile.  Localized infection.  Discussed with her the role of incision and drainage however abscess appears nonfluctuant and I do not think I would be successful at this.  We jointly agreed with start oral antibiotics, particularly as she has had some improvement over the past 2 days.  Close vigilance advised, wound recheck appointment scheduled in 2 days with me. - doxycycline  (VIBRA-TABS) 100 MG tablet; Take 1 tablet (100 mg total) by mouth 2 (two) times daily.  Dispense: 10 tablet; Refill: 0    I am having Rachel Compton maintain her medroxyPROGESTERone, mometasone, ondansetron, butalbital-acetaminophen-caffeine, and Cholecalciferol.   No orders of the defined types were placed in this encounter.   Return precautions given.   Risks, benefits, and alternatives of the medications and treatment plan prescribed today were discussed, and patient expressed understanding.   Education regarding symptom management and diagnosis given to patient on AVS.  Continue to follow with [name], FNP for routine health maintenance.   Rachel Compton and I agreed with plan.   [name], FNP

## 2018-04-11 NOTE — Patient Instructions (Signed)
Start doxycycline; avoid sun on this medication as it can make sunburn easier  Ensure to take probiotics while on antibiotics and also for 2 weeks after completion. It is important to re-colonize the gut with good bacteria and also to prevent any diarrheal infections associated with antibiotic use.   Close vigilance as we discussed. Please ensure redness, pain, swelling doesn't worsen. Let me know asap  Wound recheck Friday at 3pm

## 2018-04-13 ENCOUNTER — Ambulatory Visit: Payer: BC Managed Care – PPO | Admitting: Family

## 2018-04-16 ENCOUNTER — Encounter: Payer: Self-pay | Admitting: Family

## 2018-04-16 ENCOUNTER — Ambulatory Visit: Payer: BC Managed Care – PPO | Admitting: Family

## 2018-04-16 VITALS — BP 100/72 | HR 74 | Temp 98.6°F | Resp 16 | Ht 68.0 in | Wt 175.4 lb

## 2018-04-16 DIAGNOSIS — L0292 Furuncle, unspecified: Secondary | ICD-10-CM

## 2018-04-16 DIAGNOSIS — R591 Generalized enlarged lymph nodes: Secondary | ICD-10-CM | POA: Diagnosis not present

## 2018-04-16 NOTE — Assessment & Plan Note (Signed)
Asymptomatic.  No B symptoms.  Suspect lymphadenopathyt on the right forearm may be related to recent boil however suspected lymphadenopathy of the left forearm nonspecific at this point.  Considering lipoma however pending ultrasound for further evaluation at this time.

## 2018-04-16 NOTE — Patient Instructions (Signed)
Let  Me know if redness doesn't continue to improve on doxycycline   Today we discussed referrals, orders. Ultrasound of both forearms   I have placed these orders in the system for you.  Please be sure to give us a call if you have not heard from our office regarding scheduling a test or regarding referral in a timely manner.  It is very important that you let me know as soon as possible.

## 2018-04-16 NOTE — Progress Notes (Signed)
Subjective:    Patient ID: Rachel Compton, female    DOB: Dec 19, 1990, 27 y.o.   MRN: [id number]  CC: Rachel Compton is a 27 y.o. female who presents today for follow up.   HPI: Feeling well today.  Redness on arm has improved. Still have 5-6 days left of doxcycline No fever, N, V, weight  Loss, night sweats.      HISTORY:  Past Medical History:  Diagnosis Date  . Asthma   . Chicken pox   . Migraine    04/22/08 MRI normal    Past Surgical History:  Procedure Laterality Date  . NO PAST SURGERIES     Family History  Adopted: Yes  Problem Relation Age of Onset  . Cancer Neg Hx   . Diabetes Neg Hx   . Hyperlipidemia Neg Hx   . Hypertension Neg Hx   . Thyroid disease Neg Hx     Allergies: Penicillins Current Outpatient Medications on File Prior to Visit  Medication Sig Dispense Refill  . butalbital-acetaminophen-caffeine (FIORICET, ESGIC) 50-325-40 MG tablet Take 1-2 tablets by mouth 2 (two) times daily as needed for headache. 30 tablet 1  . Cholecalciferol 50000 units TABS 50,000 units PO qwk for 8 weeks. 8 tablet 0  . doxycycline (VIBRA-TABS) 100 MG tablet Take 1 tablet (100 mg total) by mouth 2 (two) times daily. 10 tablet 0  . mometasone (NASONEX) 50 MCG/ACT nasal spray Place 2 sprays into the nose daily. 17 g 12  . ondansetron (ZOFRAN) 4 MG tablet Take 1 tablet (4 mg total) by mouth 2 (two) times daily as needed for nausea or vomiting. 30 tablet 0  . medroxyPROGESTERone (DEPO-PROVERA) 150 MG/ML injection Inject 1 mL (150 mg total) into the muscle every 3 (three) months. 1 mL 3   No current facility-administered medications on file prior to visit.     Social History   Tobacco Use  . Smoking status: Never Smoker  . Smokeless tobacco: Never Used  Substance Use Topics  . Alcohol use: Yes    Alcohol/week: 5.0 standard drinks    Types: 5 Standard drinks or equivalent per week  . Drug use: No    Review of Systems  Constitutional: Negative for chills, fatigue,  fever and unexpected weight change.  Respiratory: Negative for cough.   Cardiovascular: Negative for chest pain and palpitations.  Gastrointestinal: Negative for nausea and vomiting.  Skin: Positive for rash (improving). Negative for color change and wound.      Objective:    BP 100/72 (BP Location: Left Arm, Patient Position: Sitting, Cuff Size: Normal)   Pulse 74   Temp 98.6 F (37 C) (Oral)   Resp 16   Ht 5\' 8"  (1.727 m)   Wt 175 lb 6 oz (79.5 kg)   SpO2 99%   BMI 26.67 kg/m  BP Readings from Last 3 Encounters:  04/16/18 100/72  04/11/18 110/80  02/09/18 114/82   Wt Readings from Last 3 Encounters:  04/16/18 175 lb 6 oz (79.5 kg)  04/11/18 174 lb 8 oz (79.2 kg)  02/09/18 172 lb 12 oz (78.4 kg)    Physical Exam  Constitutional: She appears well-developed and well-nourished.  Eyes: Conjunctivae are normal.  Cardiovascular: Normal rate, regular rhythm, normal heart sounds and normal pulses.  Pulmonary/Chest: Effort normal and breath sounds normal. She has no wheezes. She has no rhonchi. She has no rales.  Lymphadenopathy:       Head (right side): No submental, no submandibular, no tonsillar, no preauricular and  no posterior auricular adenopathy present.       Head (left side): No submandibular, no tonsillar, no preauricular, no posterior auricular and no occipital adenopathy present.    She has no cervical adenopathy.       Right: Epitrochlear adenopathy present.       Left: Epitrochlear adenopathy present.  bilateral forearms, 5-6 small  well circumscribed, < 1cm  palpable masses along a linear formation. Non tender. Non fluctuant.   Neurological: She is alert.  Skin: Skin is warm and dry.     Flat, non tender area of erythema. No increased heat. Non fluctuant.   Psychiatric: She has a normal mood and affect. Her speech is normal and behavior is normal. Thought content normal.  Vitals reviewed.      Assessment & Plan:   Problem List Items Addressed This Visit        Musculoskeletal and Integument   Boil    Improvement.  Advised to continue antibiotics at this time patient will let me know if no improvement.        Immune and Lymphatic   Lymphadenopathy - Primary    Asymptomatic.  No B symptoms.  Suspect lymphadenopathyt on the right forearm may be related to recent boil however suspected lymphadenopathy of the left forearm nonspecific at this point.  Considering lipoma however pending ultrasound for further evaluation at this time.      Relevant Orders   [place] MiscellaneoUS Localization       I am having Rachel Compton maintain her medroxyPROGESTERone, mometasone, ondansetron, butalbital-acetaminophen-caffeine, Cholecalciferol, and doxycycline.   No orders of the defined types were placed in this encounter.   Return precautions given.   Risks, benefits, and alternatives of the medications and treatment plan prescribed today were discussed, and patient expressed understanding.   Education regarding symptom management and diagnosis given to patient on AVS.  Continue to follow with [name], FNP for routine health maintenance.   Rachel Elders and I agreed with plan.   [name], FNP

## 2018-04-16 NOTE — Assessment & Plan Note (Signed)
Improvement.  Advised to continue antibiotics at this time patient will let me know if no improvement.

## 2018-04-19 ENCOUNTER — Telehealth: Payer: Self-pay | Admitting: Family

## 2018-04-19 DIAGNOSIS — R591 Generalized enlarged lymph nodes: Secondary | ICD-10-CM

## 2018-04-19 NOTE — Telephone Encounter (Signed)
Copied from CRM [phone]#[id number]. Topic: Quick Communication - See Telephone Encounter >> Apr 19, 2018  2:13 PM Jens SomMedley, Jennifer A wrote: CRM for notification. See Telephone encounter for: 04/19/18. Brianna from [facility] is calling for Rashida requesting 2 separate orders for ultrasound upper extremity Soft tissue 1 left & 1 Right Arm Please advise [phone]

## 2018-04-19 NOTE — Telephone Encounter (Signed)
Please return call.

## 2018-04-24 NOTE — Telephone Encounter (Signed)
New order needed please and Thank you! See previous note with what's needed.

## 2018-04-25 NOTE — Telephone Encounter (Signed)
ordered

## 2018-05-02 ENCOUNTER — Ambulatory Visit (INDEPENDENT_AMBULATORY_CARE_PROVIDER_SITE_OTHER): Payer: BC Managed Care – PPO

## 2018-05-02 DIAGNOSIS — Z3042 Encounter for surveillance of injectable contraceptive: Secondary | ICD-10-CM

## 2018-05-02 MED ORDER — MEDROXYPROGESTERONE ACETATE 150 MG/ML IM SUSP
150.0000 mg | Freq: Once | INTRAMUSCULAR | Status: AC
  Administered 2018-05-02: 150 mg via INTRAMUSCULAR

## 2018-05-02 NOTE — Progress Notes (Signed)
Pt here for depo which was given IM left deltoid.  NDC# 0548-5701-00 

## 2018-05-02 NOTE — Telephone Encounter (Signed)
Pt has been scheduled. Thank you.

## 2018-06-04 ENCOUNTER — Ambulatory Visit (HOSPITAL_COMMUNITY): Payer: BC Managed Care – PPO

## 2018-06-08 ENCOUNTER — Ambulatory Visit (HOSPITAL_COMMUNITY)
Admission: RE | Admit: 2018-06-08 | Discharge: 2018-06-08 | Disposition: A | Discharge status: Home / Self Care | Payer: BC Managed Care – PPO | Source: Ambulatory Visit | Attending: Family | Admitting: Family

## 2018-06-08 DIAGNOSIS — L989 Disorder of the skin and subcutaneous tissue, unspecified: Secondary | ICD-10-CM | POA: Insufficient documentation

## 2018-06-08 DIAGNOSIS — R591 Generalized enlarged lymph nodes: Secondary | ICD-10-CM

## 2018-07-20 ENCOUNTER — Ambulatory Visit: Payer: BC Managed Care – PPO

## 2018-07-23 ENCOUNTER — Ambulatory Visit: Payer: BC Managed Care – PPO

## 2018-07-30 ENCOUNTER — Ambulatory Visit (INDEPENDENT_AMBULATORY_CARE_PROVIDER_SITE_OTHER): Payer: BC Managed Care – PPO

## 2018-07-30 DIAGNOSIS — Z3042 Encounter for surveillance of injectable contraceptive: Secondary | ICD-10-CM | POA: Diagnosis not present

## 2018-07-30 MED ORDER — MEDROXYPROGESTERONE ACETATE 150 MG/ML IM SUSP
150.0000 mg | Freq: Once | INTRAMUSCULAR | Status: AC
  Administered 2018-07-30: 150 mg via INTRAMUSCULAR

## 2018-07-30 NOTE — Progress Notes (Signed)
Pt presents for Depo Provera injection. Given IM Right Deltoid per patient request.

## 2018-10-17 ENCOUNTER — Other Ambulatory Visit: Payer: Self-pay | Admitting: Obstetrics and Gynecology

## 2018-10-17 DIAGNOSIS — Z30013 Encounter for initial prescription of injectable contraceptive: Secondary | ICD-10-CM

## 2018-10-22 ENCOUNTER — Other Ambulatory Visit: Payer: Self-pay

## 2018-10-22 ENCOUNTER — Ambulatory Visit (INDEPENDENT_AMBULATORY_CARE_PROVIDER_SITE_OTHER): Payer: BC Managed Care – PPO

## 2018-10-22 DIAGNOSIS — Z3042 Encounter for surveillance of injectable contraceptive: Secondary | ICD-10-CM | POA: Diagnosis not present

## 2018-10-22 MED ORDER — MEDROXYPROGESTERONE ACETATE 150 MG/ML IM SUSP: 150.0000 mg | Freq: Once | INTRAMUSCULAR | Status: DC

## 2018-10-24 IMAGING — DX DG ANKLE COMPLETE 3+V*R*
3 series · 3 of 3 positions shown · non-contrast
Comparison: None.

CLINICAL DATA: Recent inversion injury with pain, initial encounter

EXAM:
RIGHT ANKLE - COMPLETE 3+ VIEW

[ankle ap]
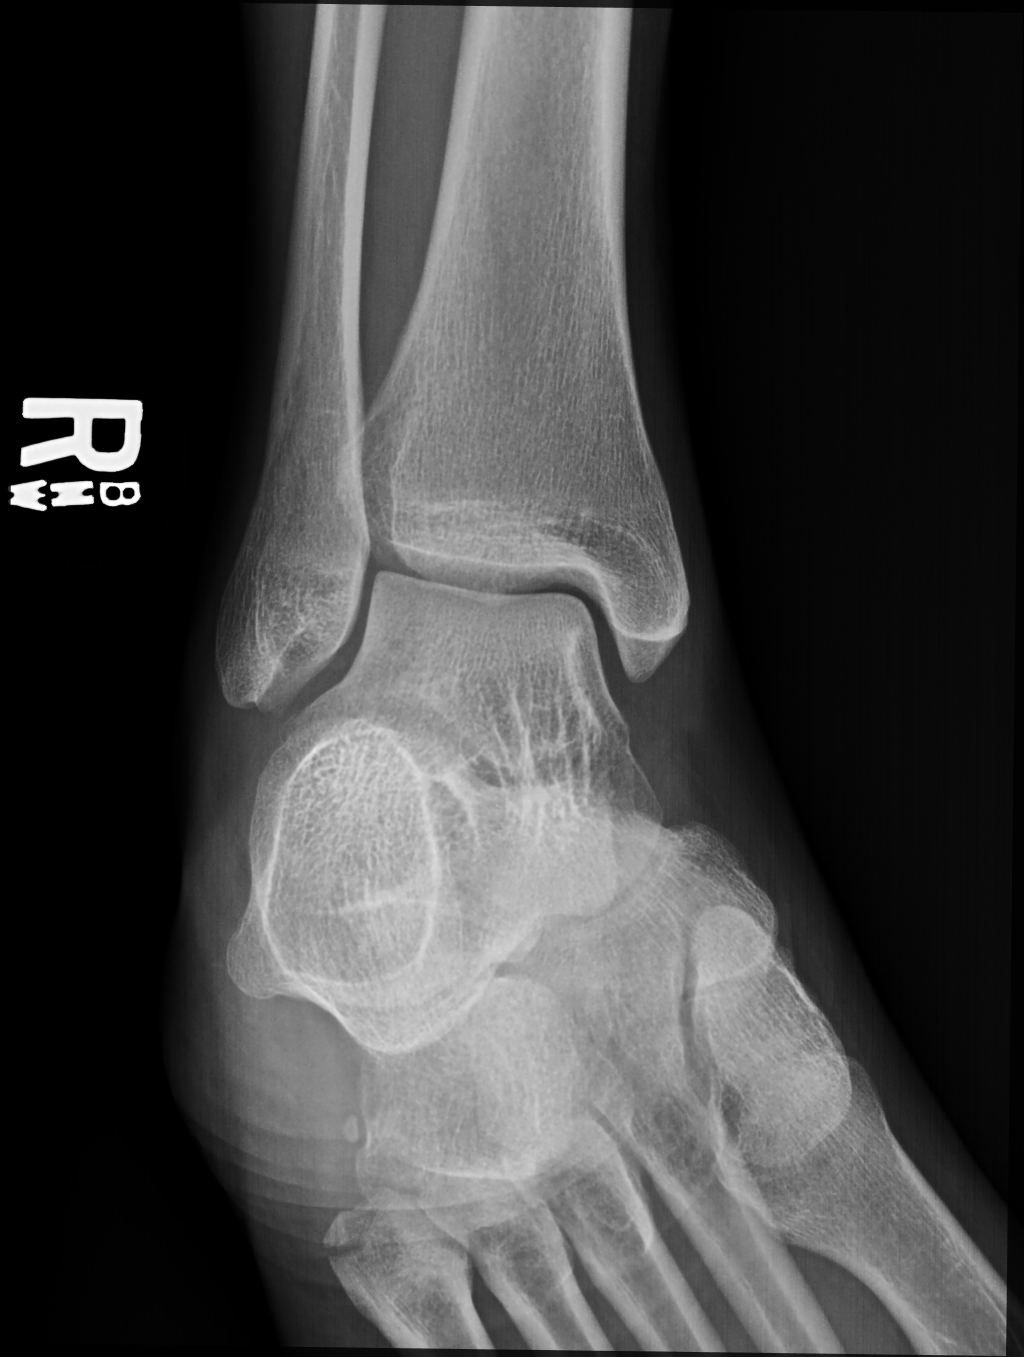

[ankle obl (oblique)]
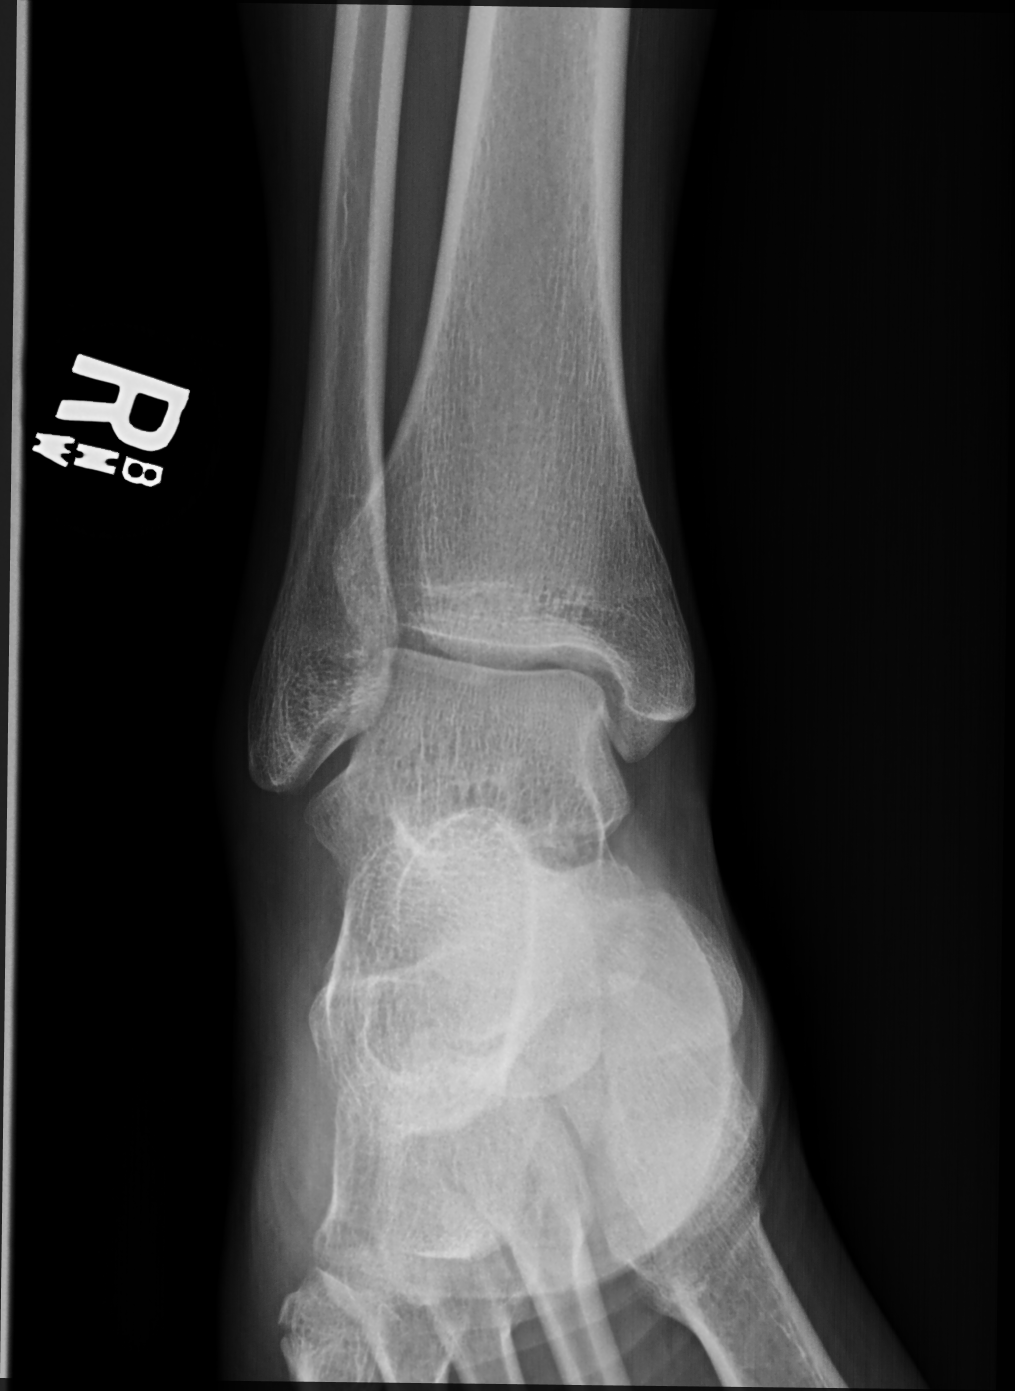

[ankle lat]
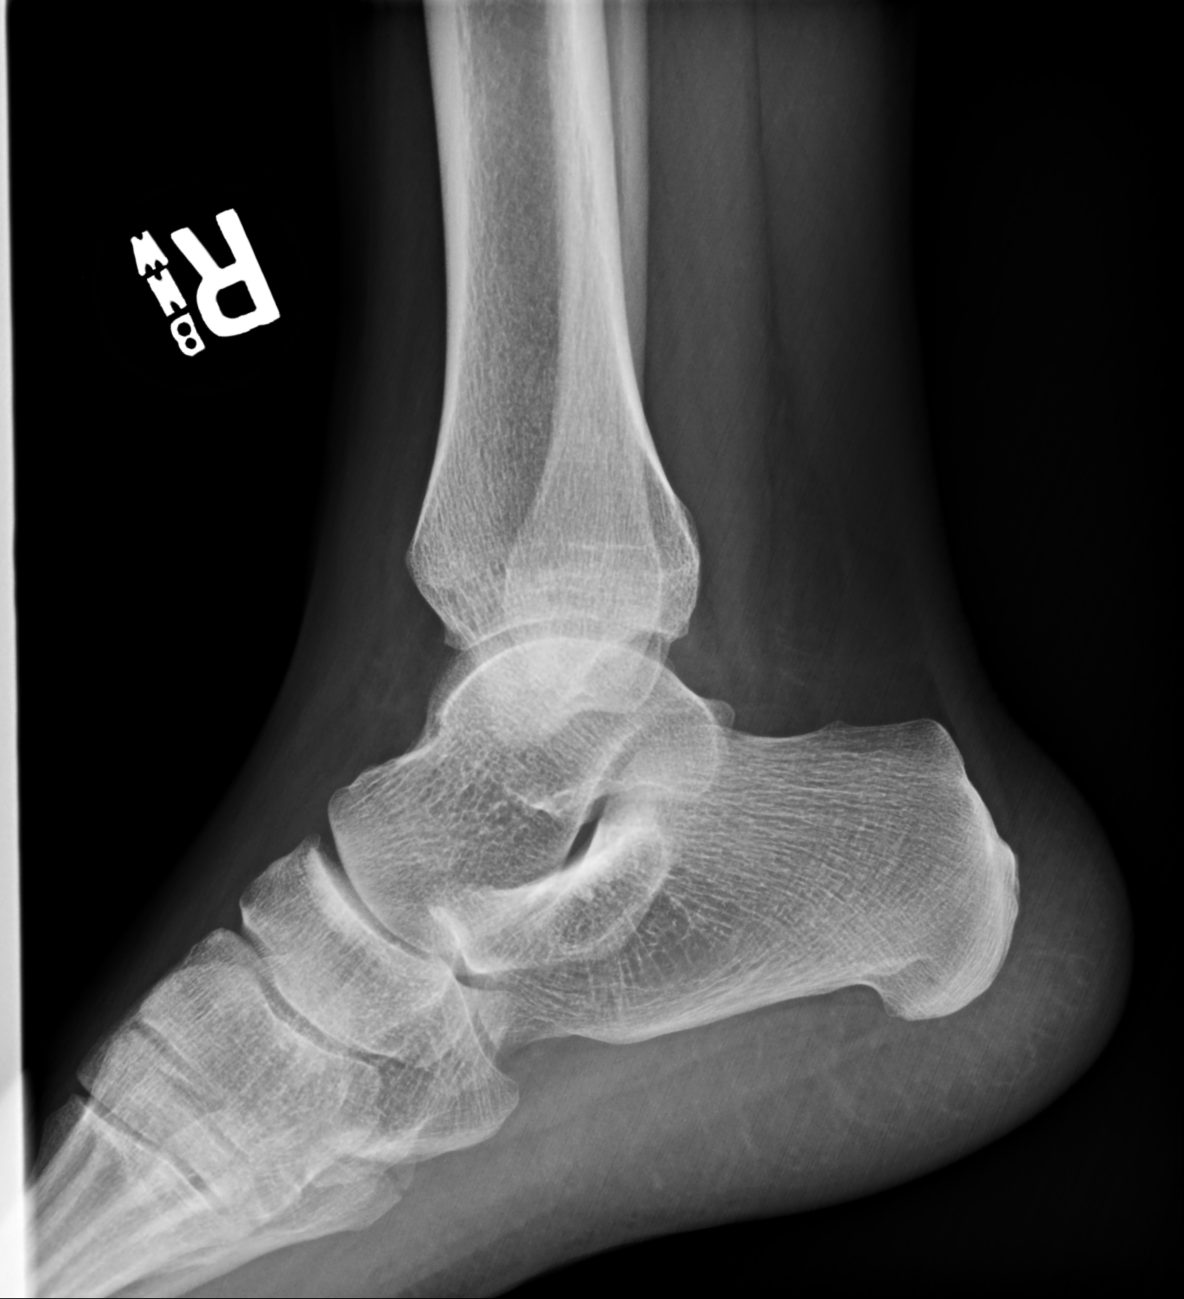

[3 of 3 positions shown; findings below may reference images not displayed]

FINDINGS: There is a fracture through the base of the fifth metatarsal similar
to that seen on the prior foot film. No other fracture or
dislocation is seen. No soft tissue changes are noted.
IMPRESSION: Fifth metatarsal fracture as described.

## 2019-01-09 ENCOUNTER — Other Ambulatory Visit: Payer: Self-pay | Admitting: Obstetrics and Gynecology

## 2019-01-09 DIAGNOSIS — Z30013 Encounter for initial prescription of injectable contraceptive: Secondary | ICD-10-CM

## 2019-01-14 ENCOUNTER — Other Ambulatory Visit: Payer: Self-pay

## 2019-01-14 ENCOUNTER — Ambulatory Visit (INDEPENDENT_AMBULATORY_CARE_PROVIDER_SITE_OTHER): Payer: BC Managed Care – PPO

## 2019-01-14 DIAGNOSIS — Z3042 Encounter for surveillance of injectable contraceptive: Secondary | ICD-10-CM | POA: Diagnosis not present

## 2019-01-14 MED ORDER — MEDROXYPROGESTERONE ACETATE 150 MG/ML IM SUSP
150.0000 mg | Freq: Once | INTRAMUSCULAR | Status: AC
  Administered 2019-01-14: 150 mg via INTRAMUSCULAR

## 2019-01-14 NOTE — Progress Notes (Signed)
Pt presents for Depo Provera injection within dates. Given IM Right Deltoid per patient request. Patient tolerated well.

## 2019-02-05 ENCOUNTER — Ambulatory Visit (INDEPENDENT_AMBULATORY_CARE_PROVIDER_SITE_OTHER): Payer: BC Managed Care – PPO | Admitting: Family Medicine

## 2019-02-05 ENCOUNTER — Other Ambulatory Visit: Payer: Self-pay

## 2019-02-05 DIAGNOSIS — G43009 Migraine without aura, not intractable, without status migrainosus: Secondary | ICD-10-CM

## 2019-02-05 DIAGNOSIS — Z20822 Contact with and (suspected) exposure to covid-19: Secondary | ICD-10-CM

## 2019-02-05 DIAGNOSIS — Z20828 Contact with and (suspected) exposure to other viral communicable diseases: Secondary | ICD-10-CM | POA: Diagnosis not present

## 2019-02-05 MED ORDER — SUMATRIPTAN SUCCINATE 50 MG PO TABS: 50.0000 mg | ORAL_TABLET | ORAL | 0 refills | Status: DC | PRN

## 2019-02-05 NOTE — Progress Notes (Signed)
Patient ID: Rachel Compton, female   DOB: 1990-08-06, 28 y.o.   MRN: [id number]    Virtual Visit via video Note  This visit type was conducted due to national recommendations for restrictions regarding the COVID-19 pandemic (e.g. social distancing).  This format is felt to be most appropriate for this patient at this time.  All issues noted in this document were discussed and addressed.  No physical exam was performed (except for noted visual exam findings with Video Visits).   I connected with Rachel Compton today at  3:40 PM EDT by a video enabled telemedicine application and verified that I am speaking with the correct person using two identifiers. Location patient: home Location provider: work or home office Persons participating in the virtual visit: patient, provider  I discussed the limitations, risks, security and privacy concerns of performing an evaluation and management service by video and the availability of in person appointments. I also discussed with the patient that there may be a patient responsible charge related to this service. The patient expressed understanding and agreed to proceed.  HPI:  Patient and I connected via video due to complaint of migraine headache on left side of head.  Patient states she has struggled with migraines off and on throughout the years, but has not had an issue with migraines in the past 2 years.  Patient states she has taken both Tylenol total in the past that did have good effect to help break migraine however when she took dose this time it did not work.  States the left side of her head and face aches.  Denies sharp stabbing pain in head.  Denies any loss of vision or facial droop.  Denies any speech difficulty or one-sided extremity weakness. No flashing lights, haze or odd smells with migraine.  States current migraine feels similar to migraine she has had in the past.  Patient also reports she was tested for COVID-19 yesterday at the urgent  care.  Per patient 1 of her coworkers had sore throat and congestion/other COVID-19 symptoms so she and a coworker were advised to go and be tested at urgent care.  Patient currently denies any fever or chills, cough/shortness of breath or wheezing, chest pain, body aches, congestion, sore throat, GI or GU complaints.   ROS: See pertinent positives and negatives per HPI.  Past Medical History:  Diagnosis Date   Asthma    Chicken pox    Migraine    04/22/08 MRI normal     Past Surgical History:  Procedure Laterality Date   NO PAST SURGERIES      Family History  Adopted: Yes  Problem Relation Age of Onset   Cancer Neg Hx    Diabetes Neg Hx    Hyperlipidemia Neg Hx    Hypertension Neg Hx    Thyroid disease Neg Hx    Social History   Tobacco Use   Smoking status: Never Smoker   Smokeless tobacco: Never Used  Substance Use Topics   Alcohol use: Yes    Alcohol/week: 5.0 standard drinks    Types: 5 Standard drinks or equivalent per week    Current Outpatient Medications:    doxycycline (VIBRA-TABS) 100 MG tablet, Take 1 tablet (100 mg total) by mouth 2 (two) times daily., Disp: 10 tablet, Rfl: 0   medroxyPROGESTERone Acetate 150 MG/ML SUSY, ADMINISTER 1 ML(150 MG) IN THE MUSCLE EVERY 3 MONTHS, Disp: 1 mL, Rfl: 0   mometasone (NASONEX) 50 MCG/ACT nasal spray, Place 2 sprays into  the nose daily., Disp: 17 g, Rfl: 12   ondansetron (ZOFRAN) 4 MG tablet, Take 1 tablet (4 mg total) by mouth 2 (two) times daily as needed for nausea or vomiting., Disp: 30 tablet, Rfl: 0   Cholecalciferol 50000 units TABS, 50,000 units PO qwk for 8 weeks., Disp: 8 tablet, Rfl: 0  Current Facility-Administered Medications:    medroxyPROGESTERone (DEPO-PROVERA) injection 150 mg, 150 mg, Intramuscular, Once, Vena AustriaStaebler, Andreas, MD  EXAM:  GENERAL: alert, oriented, appears well and in no acute distress  HEENT: atraumatic, conjunttiva clear, no obvious abnormalities on inspection of  external nose and ears  NECK: normal movements of the head and neck  LUNGS: on inspection no signs of respiratory distress, breathing rate appears normal, no obvious gross SOB, gasping or wheezing  CV: no obvious cyanosis  MS: moves all visible extremities without noticeable abnormality  PSYCH/NEURO: pleasant and cooperative, no obvious depression or anxiety, speech and thought processing grossly intact  ASSESSMENT AND PLAN:  Discussed the following assessment and plan:  Migraine without aura- patient's headache does sound consistent with a migraine.  Due to butalbital not being effective for abortive therapy, we will try Imitrex.  Patient advised she can take a Tylenol or ibuprofen with dose of Imitrex as a good way to help break the headache.  Also advised to keep up good fluid intake, get proper rest, avoid using phone or computer for too long over time to help give rise of rest and allow headache pains to improve.  Advised if headache symptoms worsen and she develops any neurological symptoms to call office and or go to ER right away for evaluation.  Exposure to COVID-19-patient was tested for COVID-19 at urgent care.  Advised at this time since she has been tested she is considered a patient under investigation and needs remain home on a self quarantine until her results are back.  Patient advised to reach back out to the urgent care where she got testing for out of work note/further instruction on when to expect results.    I discussed the assessment and treatment plan with the patient. The patient was provided an opportunity to ask questions and all were answered. The patient agreed with the plan and demonstrated an understanding of the instructions.   The patient was advised to call back or seek an in-person evaluation if the symptoms worsen or if the condition fails to improve as anticipated.   Tracey HarriesLauren M Tauheed Mcfayden, FNP

## 2019-02-22 ENCOUNTER — Other Ambulatory Visit: Payer: Self-pay

## 2019-02-22 ENCOUNTER — Encounter: Payer: Self-pay | Admitting: Family

## 2019-02-22 ENCOUNTER — Telehealth (INDEPENDENT_AMBULATORY_CARE_PROVIDER_SITE_OTHER): Payer: BC Managed Care – PPO | Admitting: Family

## 2019-02-22 DIAGNOSIS — K5900 Constipation, unspecified: Secondary | ICD-10-CM | POA: Diagnosis not present

## 2019-02-22 DIAGNOSIS — R51 Headache: Secondary | ICD-10-CM | POA: Diagnosis not present

## 2019-02-22 MED ORDER — LACTULOSE 20 GM/30ML PO SOLN: ORAL | 3 refills | Status: DC

## 2019-02-22 NOTE — Assessment & Plan Note (Signed)
Patient well appearing. Acute on chronic constipation. Pending celiac abs.  BM today and passing  Gas. Advised to trial lactulose. If no resolve today and certainly if bloating were to continue, I would have concern for obstruction and advised ER evaluation with imaging. Patient verbalized understanding.

## 2019-02-22 NOTE — Progress Notes (Signed)
This visit type was conducted due to national recommendations for restrictions regarding the COVID-19 pandemic (e.g. social distancing).  This format is felt to be most appropriate for this patient at this time.  All issues noted in this document were discussed and addressed.  No physical exam was performed (except for noted visual exam findings with Video Visits). Virtual Visit via Video Note  I connected with@  on 02/22/19 at  2:30 PM EDT by a video enabled telemedicine application and verified that I am speaking with the correct person using two identifiers.  Location patient: home Location provider:work  Persons participating in the virtual visit: patient, provider  I discussed the limitations of evaluation and management by telemedicine and the availability of in person appointments. The patient expressed understanding and agreed to proceed.   HPI:  CC: constipation and bloating x 7 days.  Had BM today , thinks from magnesium citrate. Soft brown. No blood seen nor burgundy color of stool.  Bloating improved after BM.    Endorses passing gas, bloating, 'little nausea'. Had abdominal cramping 2-3 days ago, resolved.   H/o constipation and will OTC with resolve. Thought perhaps related to diary and has cut out diary. No diarrhea, abdominal pain, V, fever, dysuria, weight loss.   Yesterday notes episode of epigastric burning and tasted acid come in mouth. No cp. Has been on prilosec in the past.   No h/o IBS.   Tried pepto bismal, gas x, magnesium citrate, miralax. Some relief with Fleet suppositories.  Drinking lots of water. No eaten today.   NO abdominal surgeries  No concerns for pregnancy. ON depo, and no menses.   Headache- comes and goes. Feels similar to HA's in the past. Thinks related to allergies, thinks claritin is helping. Getting 3 HA's per week. May go away on there own or will take imitrex, with resolve.    ROS: See pertinent positives and negatives per  HPI.  Past Medical History:  Diagnosis Date  . Asthma   . Chicken pox   . Migraine    04/22/08 MRI normal     Past Surgical History:  Procedure Laterality Date  . NO PAST SURGERIES      Family History  Adopted: Yes  Problem Relation Age of Onset  . Cancer Neg Hx   . Diabetes Neg Hx   . Hyperlipidemia Neg Hx   . Hypertension Neg Hx   . Thyroid disease Neg Hx     SOCIAL HX: never smoker   Current Outpatient Medications:  .  medroxyPROGESTERone Acetate 150 MG/ML SUSY, ADMINISTER 1 ML(150 MG) IN THE MUSCLE EVERY 3 MONTHS, Disp: 1 mL, Rfl: 0 .  Simethicone (GAS-X PO), Take 1 capsule by mouth. PRN as needed for gas & bloating., Disp: , Rfl:  .  SUMAtriptan (IMITREX) 50 MG tablet, Take 1 tablet (50 mg total) by mouth every 2 (two) hours as needed for migraine. May repeat in 2 hours if headache persists. Max 4 tab in a day., Disp: 10 tablet, Rfl: 0 .  Lactulose 20 GM/30ML SOLN, Take 30 ml every 4 hours until constipation relieved., Disp: 236 mL, Rfl: 3 .  mometasone (NASONEX) 50 MCG/ACT nasal spray, Place 2 sprays into the nose daily. (Patient not taking: Reported on 02/22/2019), Disp: 17 g, Rfl: 12  Current Facility-Administered Medications:  .  medroxyPROGESTERone (DEPO-PROVERA) injection 150 mg, 150 mg, Intramuscular, Once, Malachy Mood, MD  EXAMTonette Bihari per patient if applicable:  GENERAL: alert, oriented, appears well and in no acute  distress  HEENT: atraumatic, conjunttiva clear, no obvious abnormalities on inspection of external nose and ears  NECK: normal movements of the head and neck  LUNGS: on inspection no signs of respiratory distress, breathing rate appears normal, no obvious gross SOB, gasping or wheezing  CV: no obvious cyanosis  MS: moves all visible extremities without noticeable abnormality  PSYCH/NEURO: pleasant and cooperative, no obvious depression or anxiety, speech and thought processing grossly intact  ASSESSMENT AND PLAN:  Discussed the  following assessment and plan:  Problem List Items Addressed This Visit      Other   Acute nonintractable headache    Unchanged. Occurring 3 times per week. Relief with Imitrex. Doesn't feel interfering with QOL at this time. Declines further evaluation including MRI brain and daily preventative medication. She will let us know.       Constipation - Primary    Patient well appearing. Acute on chronic constipation. Pending celiac abs.  BM today and passing  Gas. Advised to trial lactulose. If no resolve today and certainly if bloating were to continue, I would have concern for obstruction and advised ER evaluation with imaging. Patient verbalized understanding.      Relevant Medications   Lactulose 20 GM/30ML SOLN   Other Relevant Orders   Celiac Disease Ab Screen w/Rfx      I discussed the assessment and treatment plan with the patient. The patient was provided an opportunity to ask questions and all were answered. The patient agreed with the plan and demonstrated an understanding of the instructions.   The patient was advised to call back or seek an in-person evaluation if the symptoms worsen or if the condition fails to improve as anticipated.   Rennie PlowmanMargaret Arnett, FNP

## 2019-02-22 NOTE — Assessment & Plan Note (Signed)
Unchanged. Occurring 3 times per week. Relief with Imitrex. Doesn't feel interfering with QOL at this time. Declines further evaluation including MRI brain and daily preventative medication. She will let us know.

## 2019-02-22 NOTE — Patient Instructions (Addendum)
Trial of lactulose.   Plenty of water   If doesn't resolve over the weekend or certainly if bloating were to continue, I would have concern for obstruction and I would like you to to the emergency room for evaluation with imaging.   Additionally , you may want to start Pepcid AC for suspected acid reflux after constipation resolved.   Please let me know about your headaches as well. Please consider further evaluation and treatment with daily medication.     Constipation, Adult Constipation is when a person:  Poops (has a bowel movement) fewer times in a week than normal.  Has a hard time pooping.  Has poop that is dry, hard, or bigger than normal. Follow these instructions at home: Eating and drinking   Eat foods that have a lot of fiber, such as: ? Fresh fruits and vegetables. ? Whole grains. ? Beans.  Eat less of foods that are high in fat, low in fiber, or overly processed, such as: ? [place] fries. ? Hamburgers. ? Cookies. ? Candy. ? Soda.  Drink enough fluid to keep your pee (urine) clear or pale yellow. General instructions  Exercise regularly or as told by your doctor.  Go to the restroom when you feel like you need to poop. Do not hold it in.  Take over-the-counter and prescription medicines only as told by your doctor. These include any fiber supplements.  Do pelvic floor retraining exercises, such as: ? Doing deep breathing while relaxing your lower belly (abdomen). ? Relaxing your pelvic floor while pooping.  Watch your condition for any changes.  Keep all follow-up visits as told by your doctor. This is important. Contact a doctor if:  You have pain that gets worse.  You have a fever.  You have not pooped for 4 days.  You throw up (vomit).  You are not hungry.  You lose weight.  You are bleeding from the anus.  You have thin, pencil-like poop (stool). Get help right away if:  You have a fever, and your symptoms suddenly get  worse.  You leak poop or have blood in your poop.  Your belly feels hard or bigger than normal (is bloated).  You have very bad belly pain.  You feel dizzy or you faint. This information is not intended to replace advice given to you by your health care provider. Make sure you discuss any questions you have with your health care provider. Document Released: 01/04/2008 Document Revised: 06/30/2017 Document Reviewed: 01/06/2016 Elsevier Patient Education  2020 [employer].

## 2019-02-27 ENCOUNTER — Other Ambulatory Visit (INDEPENDENT_AMBULATORY_CARE_PROVIDER_SITE_OTHER): Payer: BC Managed Care – PPO

## 2019-02-27 ENCOUNTER — Other Ambulatory Visit: Payer: Self-pay

## 2019-02-27 DIAGNOSIS — K5900 Constipation, unspecified: Secondary | ICD-10-CM

## 2019-03-01 LAB — CELIAC DISEASE AB SCREEN W/RFX
Antigliadin Abs, IgA: 2 units (ref 0–19)
IgA/Immunoglobulin A, Serum: 85 mg/dL — ABNORMAL LOW (ref 87–352)
Transglutaminase IgA: <2 U/mL (ref 0–3)

## 2019-03-13 ENCOUNTER — Other Ambulatory Visit: Payer: Self-pay | Admitting: Family Medicine

## 2019-03-13 DIAGNOSIS — G43009 Migraine without aura, not intractable, without status migrainosus: Secondary | ICD-10-CM

## 2019-03-14 MED ORDER — SUMATRIPTAN SUCCINATE 50 MG PO TABS: 50.0000 mg | ORAL_TABLET | ORAL | 0 refills | Status: DC | PRN

## 2019-07-12 ENCOUNTER — Other Ambulatory Visit: Payer: Self-pay | Admitting: Family

## 2019-07-12 DIAGNOSIS — G43009 Migraine without aura, not intractable, without status migrainosus: Secondary | ICD-10-CM

## 2019-08-27 ENCOUNTER — Encounter: Payer: Self-pay | Admitting: Family Medicine

## 2019-08-27 ENCOUNTER — Ambulatory Visit: Payer: BC Managed Care – PPO | Admitting: Family Medicine

## 2019-08-27 ENCOUNTER — Other Ambulatory Visit: Payer: Self-pay

## 2019-08-27 VITALS — BP 104/72 | HR 95 | Temp 97.3°F | Ht 68.0 in | Wt 173.4 lb

## 2019-08-27 DIAGNOSIS — H6121 Impacted cerumen, right ear: Secondary | ICD-10-CM | POA: Diagnosis not present

## 2019-08-27 DIAGNOSIS — H9201 Otalgia, right ear: Secondary | ICD-10-CM | POA: Diagnosis not present

## 2019-08-27 DIAGNOSIS — H6120 Impacted cerumen, unspecified ear: Secondary | ICD-10-CM | POA: Insufficient documentation

## 2019-08-27 MED ORDER — DOXYCYCLINE HYCLATE 100 MG PO TABS: 100.0000 mg | ORAL_TABLET | Freq: Two times a day (BID) | ORAL | 0 refills | Status: DC

## 2019-08-27 NOTE — Assessment & Plan Note (Signed)
In R ear- may be adding to pain  Unable to vis TM or entire canal  Ear is too painful to irrigate or remove cerumen manually   Recommend debrox otc daily and then f/u with pcp for irrigation if needed

## 2019-08-27 NOTE — Assessment & Plan Note (Signed)
With cerumen impaction (doubt entire cause of the pain)  Empirically cover with doxycycline for possible OM or OE  Debrox otc for cerumen  Once pain is improved- can f/u at pcp office to have irrigation  Update if not starting to improve in a week or if worsening

## 2019-08-27 NOTE — Progress Notes (Signed)
Subjective:    Patient ID: Rachel Compton, female    DOB: Oct 11, 1990, 29 y.o.   MRN: [id number]  This visit occurred during the SARS-CoV-2 public health emergency.  Safety protocols were in place, including screening questions prior to the visit, additional usage of staff PPE, and extensive cleaning of exam room while observing appropriate contact time as indicated for disinfecting solutions.    HPI 29 yo pt of NP Arnett presents with R ear pain   Started Saturday -woke up with it / mild dull pain  Has worsened  Now it feels like it needs to pop  A little trouble hearing out of it   No h/o ear infx  Got ear drops otc (hyalin? For ear pain homeopathic)  Took some tylenol   No congestion No ST  No cough or fever   Patient Active Problem List   Diagnosis Date Noted  . Acute otalgia, right 08/27/2019  . Cerumen impaction 08/27/2019  . Constipation 02/22/2019  . Lymphadenopathy 04/16/2018  . Other fatigue 02/09/2018  . Enlarged thyroid 02/09/2018  . Right ankle pain 11/15/2017  . Allergic rhinitis 11/15/2017  . Boil 10/04/2017  . Acute nonintractable headache 02/23/2017  . Bloating 02/23/2017  . Annual physical exam 06/09/2016   Past Medical History:  Diagnosis Date  . Asthma   . Chicken pox   . Migraine    04/22/08 MRI normal    Past Surgical History:  Procedure Laterality Date  . NO PAST SURGERIES     Social History   Tobacco Use  . Smoking status: Never Smoker  . Smokeless tobacco: Never Used  Substance Use Topics  . Alcohol use: Yes    Alcohol/week: 5.0 standard drinks    Types: 5 Standard drinks or equivalent per week  . Drug use: No   Family History  Adopted: Yes  Problem Relation Age of Onset  . Cancer Neg Hx   . Diabetes Neg Hx   . Hyperlipidemia Neg Hx   . Hypertension Neg Hx   . Thyroid disease Neg Hx    Allergies  Allergen Reactions  . Penicillins    Current Outpatient Medications on File Prior to Visit  Medication Sig Dispense Refill    . SUMAtriptan (IMITREX) 50 MG tablet TAKE 1 TABLET BY MOUTH EVERY 2 HOURS AS NEEDED FOR MIGRAINE. MAY REPEAT IN 2 HOURS IF HEADACHE PERSISTS. MAXIMUM DAILY DOSE IS 4 TABLETS 10 tablet 0   Current Facility-Administered Medications on File Prior to Visit  Medication Dose Route Frequency Provider Last Rate Last Admin  . medroxyPROGESTERone (DEPO-PROVERA) injection 150 mg  150 mg Intramuscular Once Vena Austria, MD         Review of Systems  Constitutional: Negative for activity change, appetite change, fatigue, fever and unexpected weight change.  HENT: Positive for ear pain and hearing loss. Negative for congestion, ear discharge, facial swelling, postnasal drip, rhinorrhea, sinus pressure, sinus pain, sore throat and tinnitus.   Eyes: Negative for pain, redness and visual disturbance.  Respiratory: Negative for cough, shortness of breath and wheezing.   Cardiovascular: Negative for chest pain and palpitations.  Gastrointestinal: Negative for abdominal pain, blood in stool, constipation and diarrhea.  Endocrine: Negative for polydipsia and polyuria.  Genitourinary: Negative for dysuria, frequency and urgency.  Musculoskeletal: Negative for arthralgias, back pain and myalgias.  Skin: Negative for pallor and rash.  Allergic/Immunologic: Negative for environmental allergies.  Neurological: Negative for dizziness, syncope, facial asymmetry, light-headedness and headaches.  Hematological: Negative for adenopathy. Does not bruise/bleed  easily.  Psychiatric/Behavioral: Negative for decreased concentration and dysphoric mood. The patient is not nervous/anxious.        Objective:   Physical Exam HENT:     Right Ear: External ear normal. There is impacted cerumen.     Left Ear: Tympanic membrane, ear canal and external ear normal. There is no impacted cerumen.     Ears:     Comments: R ear:  Nl appearing external ear -several piercing that appear nl  Some tenderness to push on tragus   Cerumen impaction noted  Edge of canal is not swollen Unable to visualize whole canal or TM  Pt voices discomfort with use of otoscope      Nose: No congestion.     Mouth/Throat:     Mouth: Mucous membranes are moist.  Eyes:     General:        Right eye: No discharge.        Left eye: No discharge.     Extraocular Movements: Extraocular movements intact.     Conjunctiva/sclera: Conjunctivae normal.     Pupils: Pupils are equal, round, and reactive to light.  Cardiovascular:     Rate and Rhythm: Regular rhythm. Tachycardia present.  Musculoskeletal:     Cervical back: Normal range of motion and neck supple.  Lymphadenopathy:     Cervical: No cervical adenopathy.  Skin:    General: Skin is warm and dry.     Findings: No erythema or rash.  Neurological:     Cranial Nerves: No cranial nerve deficit.  Psychiatric:        Mood and Affect: Mood normal.           Assessment & Plan:   Problem List Items Addressed This Visit      Nervous and Auditory   Cerumen impaction    In R ear- may be adding to pain  Unable to vis TM or entire canal  Ear is too painful to irrigate or remove cerumen manually   Recommend debrox otc daily and then f/u with pcp for irrigation if needed         Other   Acute otalgia, right - Primary    With cerumen impaction (doubt entire cause of the pain)  Empirically cover with doxycycline for possible OM or OE  Debrox otc for cerumen  Once pain is improved- can f/u at pcp office to have irrigation  Update if not starting to improve in a week or if worsening

## 2019-08-27 NOTE — Patient Instructions (Signed)
You have a wax build up in your right ear- so I cannot see much of what is going on  To cover for possible infection- take the doxycycline as directed  Ibuprofen may help  A warm compress over ear may also help   Get Debrox (or something similar) for ear wax and use it daily for the next week to help loosen and dissolve ear wax  Schedule an appointment in your regular office for 1-2 weeks for a re check / they can flush the ear if needed    Update [place] if symptoms worsen or do not improve in the meantime

## 2019-09-17 ENCOUNTER — Ambulatory Visit: Payer: BC Managed Care – PPO | Admitting: Family

## 2019-09-27 ENCOUNTER — Encounter: Payer: Self-pay | Admitting: Family Medicine

## 2019-09-27 ENCOUNTER — Ambulatory Visit: Payer: BC Managed Care – PPO | Admitting: Family Medicine

## 2019-09-27 ENCOUNTER — Other Ambulatory Visit: Payer: Self-pay

## 2019-09-27 DIAGNOSIS — N63 Unspecified lump in unspecified breast: Secondary | ICD-10-CM

## 2019-09-27 NOTE — Patient Instructions (Signed)
Our patient care coordinator will call you to schedule breast imaging   If you develop more pain or any skin redness or change let me know  Wear a supportive bra  Try warm compresses on the sore area   Work on cutting caffeine

## 2019-09-27 NOTE — Assessment & Plan Note (Signed)
Dense area in L breast (upper/outer)  Some tenderness Suspect fibrocystic change Disc use of warm compresses and well fitting bra  Disc cutting caffeine  Handout given  Dg mm and [place] ordered

## 2019-09-27 NOTE — Progress Notes (Signed)
Subjective:    Patient ID: Rachel Compton, female    DOB: [birth year], 29 y.o.   MRN: [id number]  This visit occurred during the SARS-CoV-2 public health emergency.  Safety protocols were in place, including screening questions prior to the visit, additional usage of staff PPE, and extensive cleaning of exam room while observing appropriate contact time as indicated for disinfecting solutions.    HPI 29 yo pt of NP Arnett presents with left sided breast pain    Wt Readings from Last 3 Encounters:  09/27/19 174 lb 6 oz (79.1 kg)  08/27/19 173 lb 6 oz (78.6 kg)  04/16/18 175 lb 6 oz (79.5 kg)   26.51 kg/m   Takes oral contraceptives   She normally sleeps on stomach Sun/mon woke up with soreness  Noticed a lump which is sore to the touch  No redness or drainage  No lumps under armpit   Adopted-does not fully know her history   No personal h/o mastitis  Has fibrocystic change however   Patient Active Problem List   Diagnosis Date Noted  . Breast lump in upper outer quadrant 09/27/2019  . Acute otalgia, right 08/27/2019  . Cerumen impaction 08/27/2019  . Constipation 02/22/2019  . Lymphadenopathy 04/16/2018  . Other fatigue 02/09/2018  . Enlarged thyroid 02/09/2018  . Right ankle pain 11/15/2017  . Allergic rhinitis 11/15/2017  . Boil 10/04/2017  . Acute nonintractable headache 02/23/2017  . Bloating 02/23/2017  . Annual physical exam 06/09/2016   Past Medical History:  Diagnosis Date  . Asthma   . Chicken pox   . Migraine    04/22/08 MRI normal    Past Surgical History:  Procedure Laterality Date  . NO PAST SURGERIES     Social History   Tobacco Use  . Smoking status: Never Smoker  . Smokeless tobacco: Never Used  Substance Use Topics  . Alcohol use: Yes    Alcohol/week: 5.0 standard drinks    Types: 5 Standard drinks or equivalent per week  . Drug use: No   Family History  Adopted: Yes  Problem Relation Age of Onset  . Cancer Neg Hx   . Diabetes  Neg Hx   . Hyperlipidemia Neg Hx   . Hypertension Neg Hx   . Thyroid disease Neg Hx    Allergies  Allergen Reactions  . Penicillins    Current Outpatient Medications on File Prior to Visit  Medication Sig Dispense Refill  . Drospirenone-Ethinyl Estradiol-Levomefol (BEYAZ) 3-0.02-0.451 MG tablet Take 1 tablet by mouth daily.    . SUMAtriptan (IMITREX) 50 MG tablet TAKE 1 TABLET BY MOUTH EVERY 2 HOURS AS NEEDED FOR MIGRAINE. MAY REPEAT IN 2 HOURS IF HEADACHE PERSISTS. MAXIMUM DAILY DOSE IS 4 TABLETS 10 tablet 0   No current facility-administered medications on file prior to visit.    Review of Systems  Constitutional: Negative for activity change, appetite change, fatigue, fever and unexpected weight change.  HENT: Negative for congestion, ear pain, rhinorrhea, sinus pressure and sore throat.   Eyes: Negative for pain, redness and visual disturbance.  Respiratory: Negative for cough, shortness of breath and wheezing.   Cardiovascular: Negative for chest pain and palpitations.  Gastrointestinal: Negative for abdominal pain, blood in stool, constipation and diarrhea.  Endocrine: Negative for polydipsia and polyuria.  Genitourinary: Negative for dysuria, frequency and urgency.       L breast discomfort/lump  Musculoskeletal: Negative for arthralgias, back pain and myalgias.  Skin: Negative for pallor and rash.  Allergic/Immunologic: Negative for  environmental allergies.  Neurological: Negative for dizziness, syncope and headaches.  Hematological: Negative for adenopathy. Does not bruise/bleed easily.  Psychiatric/Behavioral: Negative for decreased concentration and dysphoric mood. The patient is not nervous/anxious.        Objective:   Physical Exam Constitutional:      General: She is not in acute distress.    Appearance: Normal appearance. She is normal weight. She is not ill-appearing or diaphoretic.  Genitourinary:    Comments: L breast: dense tissue with no skin change or  nipple d/c Some tenderness in upper/outer quadrant with a firm dense area/lump that seems deep   R breast: dense , no skin change or nipple d/c No tenderness or M noted Neurological:     Mental Status: She is alert.           Assessment & Plan:   Problem List Items Addressed This Visit      Other   Breast lump in upper outer quadrant    Dense area in L breast (upper/outer)  Some tenderness Suspect fibrocystic change Disc use of warm compresses and well fitting bra  Disc cutting caffeine  Handout given  Dg mm and [place] ordered      Relevant Orders   MM Digital Diagnostic Bilat   US BREAST LTD UNI LEFT INC AXILLA

## 2019-10-11 ENCOUNTER — Ambulatory Visit
Admission: RE | Admit: 2019-10-11 | Discharge: 2019-10-11 | Disposition: A | Discharge status: Home / Self Care | Payer: BC Managed Care – PPO | Source: Ambulatory Visit | Attending: Family Medicine | Admitting: Family Medicine

## 2019-10-11 ENCOUNTER — Other Ambulatory Visit: Payer: Self-pay

## 2019-10-11 DIAGNOSIS — N63 Unspecified lump in unspecified breast: Secondary | ICD-10-CM

## 2019-12-02 ENCOUNTER — Encounter: Payer: Self-pay | Admitting: Family

## 2019-12-02 ENCOUNTER — Telehealth (INDEPENDENT_AMBULATORY_CARE_PROVIDER_SITE_OTHER): Payer: BC Managed Care – PPO | Admitting: Family

## 2019-12-02 VITALS — Ht 68.0 in | Wt 174.0 lb

## 2019-12-02 DIAGNOSIS — J309 Allergic rhinitis, unspecified: Secondary | ICD-10-CM

## 2019-12-02 MED ORDER — FEXOFENADINE HCL 60 MG PO TABS: 60.0000 mg | ORAL_TABLET | Freq: Two times a day (BID) | ORAL | 1 refills | Status: DC

## 2019-12-02 MED ORDER — MONTELUKAST SODIUM 10 MG PO TABS: 10.0000 mg | ORAL_TABLET | Freq: Every day | ORAL | 1 refills | Status: DC

## 2019-12-02 NOTE — Progress Notes (Signed)
Virtual Visit via Video Note  I connected with@  on 12/02/19 at 11:30 AM EDT by a video enabled telemedicine application and verified that I am speaking with the correct person using two identifiers.  Location patient: home Location provider:work  Persons participating in the virtual visit: patient, provider  I discussed the limitations of evaluation and management by telemedicine and the availability of in person appointments. The patient expressed understanding and agreed to proceed.   HPI: Acute visit Chief complaint of nasal congestion, itchy watery eyes which started one week ago,unchanged. Endorses sneezing, sinus pressure Tried claritin, zyrtec, flonase NO fever, sob, cough, vision changes, photophobia, eye pain, diarrhea, nausea.    Not wearing contacts. Wears Daily contacts and removes them every day.  Normally gets allergies this time of year, thinks outside allergies, perhaps pollen.  Symptoms feel very similar to allergies in the past.  No known Covid exposure  H/o asthma as child; no symptoms as an adult  ROS: See pertinent positives and negatives per HPI.  Past Medical History:  Diagnosis Date  . Asthma   . Chicken pox   . Migraine    04/22/08 MRI normal     Past Surgical History:  Procedure Laterality Date  . NO PAST SURGERIES      Family History  Adopted: Yes  Problem Relation Age of Onset  . Cancer Neg Hx   . Diabetes Neg Hx   . Hyperlipidemia Neg Hx   . Hypertension Neg Hx   . Thyroid disease Neg Hx       Current Outpatient Medications:  .  Drospirenone-Ethinyl Estradiol-Levomefol (BEYAZ) 3-0.02-0.451 MG tablet, Take 1 tablet by mouth daily., Disp: , Rfl:  .  SUMAtriptan (IMITREX) 50 MG tablet, TAKE 1 TABLET BY MOUTH EVERY 2 HOURS AS NEEDED FOR MIGRAINE. MAY REPEAT IN 2 HOURS IF HEADACHE PERSISTS. MAXIMUM DAILY DOSE IS 4 TABLETS, Disp: 10 tablet, Rfl: 0 .  fexofenadine (ALLEGRA ALLERGY) 60 MG tablet, Take 1 tablet (60 mg total) by mouth 2 (two)  times daily., Disp: 120 tablet, Rfl: 1 .  montelukast (SINGULAIR) 10 MG tablet, Take 1 tablet (10 mg total) by mouth at bedtime., Disp: 90 tablet, Rfl: 1  EXAM:  VITALS per patient if applicable:  GENERAL: alert, oriented, appears well and in no acute distress  HEENT: atraumatic, conjunttiva clear, no obvious abnormalities on inspection of external nose and ears  NECK: normal movements of the head and neck  LUNGS: on inspection no signs of respiratory distress, breathing rate appears normal, no obvious gross SOB, gasping or wheezing  CV: no obvious cyanosis  MS: moves all visible extremities without noticeable abnormality  PSYCH/NEURO: pleasant and cooperative, no obvious depression or anxiety, speech and thought processing grossly intact  ASSESSMENT AND PLAN:  Discussed the following assessment and plan:  Allergic rhinitis, unspecified seasonality, unspecified trigger - Plan: montelukast (SINGULAIR) 10 MG tablet, fexofenadine (ALLEGRA ALLERGY) 60 MG tablet Problem List Items Addressed This Visit      Respiratory   Allergic rhinitis - Primary    Chronic.  Symptoms similar to allergies in the past.  Patient is well-appearing, nontoxic in appearance.  No respiratory distress.  Trial of Singulair with Allegra twice daily.  She will let me know how she is doing.  Certainly if symptoms were to continue, I would certainly consider bacterial infection which would require antibiotic.  Patient is aware of this and will let me know.      Relevant Medications   montelukast (SINGULAIR) 10 MG tablet  fexofenadine (ALLEGRA ALLERGY) 60 MG tablet      -we discussed possible serious and likely etiologies, options for evaluation and workup, limitations of telemedicine visit vs in person visit, treatment, treatment risks and precautions. Pt prefers to treat via telemedicine empirically rather then risking or undertaking an in person visit at this moment. Patient agrees to seek prompt in person  care if worsening, new symptoms arise, or if is not improving with treatment.   I discussed the assessment and treatment plan with the patient. The patient was provided an opportunity to ask questions and all were answered. The patient agreed with the plan and demonstrated an understanding of the instructions.   The patient was advised to call back or seek an in-person evaluation if the symptoms worsen or if the condition fails to improve as anticipated.   Mable Paris, FNP

## 2019-12-02 NOTE — Assessment & Plan Note (Signed)
Chronic.  Symptoms similar to allergies in the past.  Patient is well-appearing, nontoxic in appearance.  No respiratory distress.  Trial of Singulair with Allegra twice daily.  She will let me know how she is doing.  Certainly if symptoms were to continue, I would certainly consider bacterial infection which would require antibiotic.  Patient is aware of this and will let me know.

## 2020-03-01 HISTORY — PX: EYE SURGERY: SHX253

## 2020-03-10 ENCOUNTER — Ambulatory Visit: Admit: 2020-03-10 | Disposition: A | Discharge status: Home / Self Care | Payer: BC Managed Care – PPO

## 2020-03-10 ENCOUNTER — Encounter: Payer: Self-pay | Admitting: Emergency Medicine

## 2020-03-10 ENCOUNTER — Other Ambulatory Visit: Payer: Self-pay

## 2020-03-10 ENCOUNTER — Ambulatory Visit (INDEPENDENT_AMBULATORY_CARE_PROVIDER_SITE_OTHER)
Admission: EM | Admit: 2020-03-10 | Discharge: 2020-03-10 | Disposition: A | Discharge status: Home / Self Care | Payer: BC Managed Care – PPO | Source: Home / Self Care

## 2020-03-10 DIAGNOSIS — Z113 Encounter for screening for infections with a predominantly sexual mode of transmission: Secondary | ICD-10-CM | POA: Insufficient documentation

## 2020-03-10 DIAGNOSIS — B370 Candidal stomatitis: Secondary | ICD-10-CM | POA: Insufficient documentation

## 2020-03-10 DIAGNOSIS — L282 Other prurigo: Secondary | ICD-10-CM | POA: Diagnosis not present

## 2020-03-10 DIAGNOSIS — H1033 Unspecified acute conjunctivitis, bilateral: Secondary | ICD-10-CM | POA: Insufficient documentation

## 2020-03-10 LAB — POCT URINALYSIS DIP (MANUAL ENTRY)
Glucose, UA: NEGATIVE mg/dL
Nitrite, UA: NEGATIVE
Protein Ur, POC: 30 mg/dL — AB
Spec Grav, UA: 1.025 (ref 1.010–1.025)
Urobilinogen, UA: >=8 E.U./dL — AB
pH, UA: 5.5 (ref 5.0–8.0)

## 2020-03-10 LAB — POCT URINE PREGNANCY: Preg Test, Ur: NEGATIVE

## 2020-03-10 LAB — POCT RAPID STREP A (OFFICE): Rapid Strep A Screen: NEGATIVE

## 2020-03-10 LAB — POCT FASTING CBG KUC MANUAL ENTRY: POCT Glucose (KUC): 76 mg/dL (ref 70–99)

## 2020-03-10 MED ORDER — FLUCONAZOLE 150 MG PO TABS: 150.0000 mg | ORAL_TABLET | ORAL | 0 refills | Status: DC

## 2020-03-10 MED ORDER — NITROFURANTOIN MONOHYD MACRO 100 MG PO CAPS: 100.0000 mg | ORAL_CAPSULE | Freq: Two times a day (BID) | ORAL | 0 refills | Status: DC

## 2020-03-10 MED ORDER — POLYMYXIN B-TRIMETHOPRIM 10000-0.1 UNIT/ML-% OP SOLN: 1.0000 [drp] | OPHTHALMIC | 0 refills | Status: DC

## 2020-03-10 MED ORDER — LIDOCAINE VISCOUS HCL 2 % MT SOLN: 15.0000 mL | OROMUCOSAL | 0 refills | Status: DC | PRN

## 2020-03-10 NOTE — ED Provider Notes (Signed)
[facility]    CSN: [id number] Arrival date & time: 03/10/20  3244      History   Chief Complaint Chief Complaint  Patient presents with  . Rash  . Mouth Lesions    HPI Rachel Compton is a 29 y.o. female presenting for multiple concerns: Endorsing itchy, painful rash to mouth and hands.  States rash is on her back, arms, legs.  No one else at home has this.  Recently stated hotel she went to [place].  Went to ED there "for a few hours "and was told she had a UTI, strep, pinkeye.  Given medications, see below, which she has been compliant with.  Still having UTI symptoms (burning, frequency).  Denies eye pain, though still having some conjunctival injection.  Has not seen ophthalmology for follow-up.  Denies cough, shortness of breath, drooling, chest pain, lightheadedness or dizziness, vomiting.   Past Medical History:  Diagnosis Date  . Asthma   . Chicken pox   . Migraine    04/22/08 MRI normal     Patient Active Problem List   Diagnosis Date Noted  . Breast lump in upper outer quadrant 09/27/2019  . Acute otalgia, right 08/27/2019  . Cerumen impaction 08/27/2019  . Constipation 02/22/2019  . Lymphadenopathy 04/16/2018  . Other fatigue 02/09/2018  . Enlarged thyroid 02/09/2018  . Right ankle pain 11/15/2017  . Allergic rhinitis 11/15/2017  . Boil 10/04/2017  . Acute nonintractable headache 02/23/2017  . Bloating 02/23/2017  . Annual physical exam 06/09/2016    Past Surgical History:  Procedure Laterality Date  . NO PAST SURGERIES      OB History    Gravida  2   Para  2   Term  2   Preterm      AB      Living  2     SAB      TAB      Ectopic      Multiple      Live Births  2            Home Medications    Prior to Admission medications   Medication Sig Start Date End Date Taking? Authorizing Provider  valACYclovir (VALTREX) 500 MG tablet Take 500 mg by mouth 2 (two) times daily.   Yes [provider]   Drospirenone-Ethinyl Estradiol-Levomefol (BEYAZ) 3-0.02-0.451 MG tablet Take 1 tablet by mouth daily. 08/10/19   [provider]  fexofenadine (ALLEGRA ALLERGY) 60 MG tablet Take 1 tablet (60 mg total) by mouth 2 (two) times daily. 12/02/19   [name], FNP  fluconazole (DIFLUCAN) 150 MG tablet Take 1 tablet (150 mg total) by mouth every other day. May repeat in 72 hours if needed 03/10/20   Hall-Potvin, [place], PA-C  lidocaine (XYLOCAINE) 2 % solution Use as directed 15 mLs in the mouth or throat as needed for mouth pain. 03/10/20   Hall-Potvin, [place], PA-C  montelukast (SINGULAIR) 10 MG tablet Take 1 tablet (10 mg total) by mouth at bedtime. 12/02/19   [name], FNP  nitrofurantoin, macrocrystal-monohydrate, (MACROBID) 100 MG capsule Take 1 capsule (100 mg total) by mouth 2 (two) times daily. 03/10/20   Hall-Potvin, [place], PA-C  SUMAtriptan (IMITREX) 50 MG tablet TAKE 1 TABLET BY MOUTH EVERY 2 HOURS AS NEEDED FOR MIGRAINE. MAY REPEAT IN 2 HOURS IF HEADACHE PERSISTS. MAXIMUM DAILY DOSE IS 4 TABLETS 07/15/19   [name], FNP  trimethoprim-polymyxin b (POLYTRIM) ophthalmic solution Place 1 drop  into the left eye every 4 (four) hours. 03/10/20   Hall-Potvin, [place], PA-C    Family History Family History  Adopted: Yes  Problem Relation Age of Onset  . Cancer Neg Hx   . Diabetes Neg Hx   . Hyperlipidemia Neg Hx   . Hypertension Neg Hx   . Thyroid disease Neg Hx     Social History Social History   Tobacco Use  . Smoking status: Never Smoker  . Smokeless tobacco: Never Used  Vaping Use  . Vaping Use: Former  Substance Use Topics  . Alcohol use: Yes    Alcohol/week: 5.0 standard drinks    Types: 5 Standard drinks or equivalent per week  . Drug use: No     Allergies   Penicillins   Review of Systems As per HPI   Physical Exam Triage Vital Signs ED Triage Vitals  Enc Vitals Group     BP      Pulse      Resp      Temp      Temp src       SpO2      Weight      Height      Head Circumference      Peak Flow      Pain Score      Pain Loc      Pain Edu?      Excl. in GC?    No data found.  Updated Vital Signs BP 120/78 (BP Location: Left Arm)   Pulse (!) 108   Temp 99.7 F (37.6 C) (Oral)   Resp 18   SpO2 96%   Visual Acuity Right Eye Distance:   Left Eye Distance:   Bilateral Distance:    Right Eye Near:   Left Eye Near:    Bilateral Near:     Physical Exam Constitutional:      General: She is not in acute distress.    Appearance: She is not toxic-appearing or diaphoretic.  HENT:     Head: Normocephalic and atraumatic.     Right Ear: Tympanic membrane, ear canal and external ear normal.     Left Ear: Tympanic membrane, ear canal and external ear normal.     Nose: Nose normal.     Mouth/Throat:     Mouth: Mucous membranes are moist.     Comments: Lower lip with superficial cracking.  Thrush present on tongue, roof of mouth and buccal mucosa Eyes:     General: No scleral icterus.       Right eye: No discharge.        Left eye: No discharge.     Extraocular Movements: Extraocular movements intact.     Pupils: Pupils are equal, round, and reactive to light.     Comments: Bilateral conjunctival injection without discharge.  Cardiovascular:     Rate and Rhythm: Normal rate.  Pulmonary:     Effort: Pulmonary effort is normal. No respiratory distress.     Breath sounds: No wheezing.  Musculoskeletal:     Cervical back: Normal range of motion. No tenderness.  Lymphadenopathy:     Cervical: No cervical adenopathy.  Skin:    Capillary Refill: Capillary refill takes less than 2 seconds.     Coloration: Skin is not jaundiced or pale.     Findings: Rash present.     Comments: Papules noted to upper extremities, thighs, back.   No surrounding erythema, tenderness, discharge.  Neurological:  General: No focal deficit present.     Mental Status: She is alert and oriented to person, place, and time.       UC Treatments / Results  Labs (all labs ordered are listed, but only abnormal results are displayed) Labs Reviewed  POCT URINALYSIS DIP (MANUAL ENTRY) - Abnormal; Notable for the following components:      Result Value   Color, UA other (*)    Bilirubin, UA large (*)    Ketones, POC UA >= (160) (*)    Blood, UA trace-intact (*)    Protein Ur, POC =30 (*)    Urobilinogen, UA >=8.0 (*)    Leukocytes, UA Small (1+) (*)    All other components within normal limits  URINE CULTURE  CULTURE, GROUP A STREP ([facility])  HIV ANTIBODY (ROUTINE TESTING W REFLEX)  RPR  POCT RAPID STREP A (OFFICE)  POCT URINE PREGNANCY  POCT FASTING CBG [facility] MANUAL ENTRY    EKG   Radiology No results found.  Procedures Procedures (including critical care time)  Medications Ordered in UC Medications - No data to display  Initial Impression / Assessment and Plan / UC Course  I have reviewed the triage vital signs and the nursing notes.  Pertinent labs & imaging results that were available during my care of the patient were reviewed by me and considered in my medical decision making (see chart for details).     Patient afebrile, nontoxic in office today.  Does have thrush, conjunctivitis, recent history of strep.  Does not meet clinical criteria for rheumatic fever at this time, though discussed return precautions thereof.  Rapid strep  Negative, culture pending.  Urine dip abnormal as above.  HIV, RPR pending.  Will discontinue ER dispo medications, start Diflucan for thrush, Polytrim for eyes and have patient follow-up with ophthalmology in 1-2 days, Macrobid for UTI.  Urine culture pending.  Return precautions discussed, pt verbalized understanding and is agreeable to plan. Final Clinical Impressions(s) / UC Diagnoses   Final diagnoses:  Screening examination for venereal disease  Pruritic rash  Oral thrush  Acute bacterial conjunctivitis of both eyes     Discharge Instructions      Diflucan for thrush (1 tab every other day for 3 doses) Macrobid for UTI (1 tab 2 times a day for 5 days). Polytrim for eye (every 4 hours while awake) - important to follow up with eye doc. Go to ER for worsening symptoms, vomiting, chest pain, fever.    ED Prescriptions    Medication Sig Dispense Auth. Provider   trimethoprim-polymyxin b (POLYTRIM) ophthalmic solution Place 1 drop into the left eye every 4 (four) hours. 10 mL Hall-Potvin, [place], PA-C   fluconazole (DIFLUCAN) 150 MG tablet Take 1 tablet (150 mg total) by mouth every other day. May repeat in 72 hours if needed 3 tablet Hall-Potvin, [place], PA-C   nitrofurantoin, macrocrystal-monohydrate, (MACROBID) 100 MG capsule Take 1 capsule (100 mg total) by mouth 2 (two) times daily. 10 capsule Hall-Potvin, [place], PA-C   lidocaine (XYLOCAINE) 2 % solution Use as directed 15 mLs in the mouth or throat as needed for mouth pain. 100 mL Hall-Potvin, [place], PA-C     PDMP not reviewed this encounter.   Hall-Potvin, [place], [place] 03/10/20 1025

## 2020-03-10 NOTE — ED Triage Notes (Signed)
Pt here for itchy painful rash to mouth and hands; pt was seen at ED in [place] on Saturday and told she had a UTI, strep and pink eye; pt sts not improved

## 2020-03-10 NOTE — Discharge Instructions (Addendum)
Diflucan for thrush (1 tab every other day for 3 doses) Macrobid for UTI (1 tab 2 times a day for 5 days). Polytrim for eye (every 4 hours while awake) - important to follow up with eye doc. Go to ER for worsening symptoms, vomiting, chest pain, fever.

## 2020-03-11 ENCOUNTER — Emergency Department (HOSPITAL_BASED_OUTPATIENT_CLINIC_OR_DEPARTMENT_OTHER)
Admission: EM | Admit: 2020-03-11 | Discharge: 2020-03-11 | Discharge status: Against Medical Advice | Payer: BC Managed Care – PPO

## 2020-03-11 ENCOUNTER — Encounter (HOSPITAL_BASED_OUTPATIENT_CLINIC_OR_DEPARTMENT_OTHER): Payer: Self-pay | Admitting: *Deleted

## 2020-03-11 ENCOUNTER — Ambulatory Visit
Admission: EM | Admit: 2020-03-11 | Discharge: 2020-03-11 | Disposition: A | Discharge status: Home / Self Care | Payer: BC Managed Care – PPO | Attending: Physician Assistant | Admitting: Physician Assistant

## 2020-03-11 ENCOUNTER — Other Ambulatory Visit: Payer: Self-pay

## 2020-03-11 DIAGNOSIS — L511 Stevens-Johnson syndrome: Principal | ICD-10-CM | POA: Diagnosis present

## 2020-03-11 DIAGNOSIS — Z7289 Other problems related to lifestyle: Secondary | ICD-10-CM

## 2020-03-11 DIAGNOSIS — R509 Fever, unspecified: Secondary | ICD-10-CM

## 2020-03-11 DIAGNOSIS — B370 Candidal stomatitis: Secondary | ICD-10-CM

## 2020-03-11 DIAGNOSIS — Z20822 Contact with and (suspected) exposure to covid-19: Secondary | ICD-10-CM | POA: Diagnosis present

## 2020-03-11 DIAGNOSIS — Z880 Allergy status to penicillin: Secondary | ICD-10-CM

## 2020-03-11 DIAGNOSIS — H11433 Conjunctival hyperemia, bilateral: Secondary | ICD-10-CM | POA: Diagnosis present

## 2020-03-11 DIAGNOSIS — R21 Rash and other nonspecific skin eruption: Secondary | ICD-10-CM

## 2020-03-11 DIAGNOSIS — Z79899 Other long term (current) drug therapy: Secondary | ICD-10-CM

## 2020-03-11 DIAGNOSIS — H1033 Unspecified acute conjunctivitis, bilateral: Secondary | ICD-10-CM

## 2020-03-11 DIAGNOSIS — J302 Other seasonal allergic rhinitis: Secondary | ICD-10-CM | POA: Diagnosis present

## 2020-03-11 LAB — URINE CULTURE: Culture: NO GROWTH

## 2020-03-11 LAB — RPR: RPR Ser Ql: NONREACTIVE

## 2020-03-11 LAB — HIV ANTIBODY (ROUTINE TESTING W REFLEX): HIV Screen 4th Generation wRfx: NONREACTIVE

## 2020-03-11 MED ORDER — ACETAMINOPHEN 325 MG PO TABS
975.0000 mg | ORAL_TABLET | Freq: Once | ORAL | Status: AC
  Administered 2020-03-11: 975 mg via ORAL

## 2020-03-11 NOTE — Discharge Instructions (Addendum)
29 year old female comes in for 5 day history of rash, sore throat, eye redness, urinary symptoms. She was at first seen at the ED and was started on doxycycline for urinary symptoms. Possible rocephin injection at the time as well. She was seen yesterday at urgent care due to continued symptoms. Rash was originally to forearm and mouth, has since spread to trunk. She was found to have diffuse thrush in the mouth, and given diflucan. Also switched doxycycline to macrobid. Came in today due to rash spreading to the foot and found febrile. RPR/HIV yesterday negative.  BP 104/73 HR 117 RR 20 temp 102.6 O2 96% Constitutional: fatigued but nontoxic Eye: diffuse conjunctival injection with eye crusting.  ENT: diffuse thrush/ulceration to the mouth that is hard to view. Uvula/oropharynx with same appearance. Uvula midline. Patient handling own secretions. Heart: tachycardic, regular rhythm Lungs: CTAB  Given now febrile with systemic symptoms, diffuse thrush to the mouth, patient discharged in stable condition to the ED for further evaluation.

## 2020-03-11 NOTE — ED Notes (Signed)
Patient is being discharged from the Urgent Care and sent to the Emergency Department via POV. Per Linward Headland, PA patient is in need of higher level of care due to fever, rash. Patient is aware and verbalizes understanding of plan of care.  Vitals:   03/11/20 1431  BP: 104/73  Pulse: (!) 117  Resp: 20  Temp: (!) 102.6 F (39.2 C)  SpO2: 96%

## 2020-03-11 NOTE — ED Provider Notes (Signed)
29 year old female comes in for 5 day history of rash, sore throat, eye redness, urinary symptoms. She was at first seen at the ED and was started on doxycycline for urinary symptoms. Possible rocephin injection at the time as well. She was seen yesterday at urgent care due to continued symptoms. Rash was originally to forearm and mouth, has since spread to trunk. She was found to have diffuse thrush in the mouth, and given diflucan. Also switched doxycycline to macrobid. Came in today due to rash spreading to the foot and found febrile. RPR/HIV yesterday negative.  BP 104/73 HR 117 RR 20 temp 102.6 O2 96% Constitutional: fatigued but nontoxic Eye: diffuse conjunctival injection with eye crusting.  ENT: diffuse thrush/ulceration to the mouth that is hard to view. Uvula/oropharynx with same appearance. Uvula midline. Patient handling own secretions. Heart: tachycardic, regular rhythm Lungs: CTAB Skin: maculopapular rash to the skin with central excoriation to the trunk, BUE/BLE. Pinpoint rash to the palms and soles of feet.   Given now febrile with systemic symptoms, diffuse thrush to the mouth, patient discharged in stable condition to the ED for further evaluation.    Belinda Fisher, PA-C 03/11/20 1538

## 2020-03-11 NOTE — ED Notes (Signed)
After triage pt escorted by nurse to sofa in front of pharm, swift and steady gait.

## 2020-03-11 NOTE — ED Triage Notes (Addendum)
Pt escorted into triage by nurse w/o difficulty , pt amb with swift and steady gait. C/o eye redness and drainage, generalized fatigue with n/v x 3 days.  Pt triaged with another nurse present.

## 2020-03-11 NOTE — ED Triage Notes (Signed)
Pt was called to triage-pt with fast paced gait from vending machine to triage room-NAD-pt entered triage and sat on the floor when asked to step on scale for weight-pt was asked why she sat on the floor-pt states "I don't feel good"-asked to stand from the floor/step on scale then have a seat in chair if she wants to be checked in and there is a wait to be seen by MD-pt opened triage door aggressively/it slammed against wall and walked out again at fast pace

## 2020-03-11 NOTE — ED Triage Notes (Signed)
Pt states she has had a rash to bilateral arms, legs since Saturday. Was evaluated and tx in [place] and given doxycycline. Was evaluated here yesterday for continued mouth lesions/sores, rash and UTI symptoms yesterday. Pt reports she took the diflucan Rx yesterday but mouth lesions persist and she has been unable to tolerate solid food and limited po fluids. Here today for rash to feet that has become painful and pt states makes ambulating difficult. Also reports nausea and post nasal drip.  Pt denies fever at home, but reports chills, flank pain, abdominal pain, cough.

## 2020-03-12 ENCOUNTER — Inpatient Hospital Stay (HOSPITAL_BASED_OUTPATIENT_CLINIC_OR_DEPARTMENT_OTHER)
Admission: EM | Admit: 2020-03-12 | Discharge: 2020-03-15 | DRG: 596 | Disposition: A | Discharge status: Other Acute Inpatient Hospital | Payer: BC Managed Care – PPO | Attending: Family Medicine | Admitting: Family Medicine

## 2020-03-12 ENCOUNTER — Encounter (HOSPITAL_COMMUNITY): Payer: Self-pay | Admitting: Internal Medicine

## 2020-03-12 ENCOUNTER — Emergency Department (HOSPITAL_BASED_OUTPATIENT_CLINIC_OR_DEPARTMENT_OTHER): Payer: BC Managed Care – PPO

## 2020-03-12 DIAGNOSIS — Z79899 Other long term (current) drug therapy: Secondary | ICD-10-CM | POA: Diagnosis not present

## 2020-03-12 DIAGNOSIS — L511 Stevens-Johnson syndrome: Secondary | ICD-10-CM | POA: Diagnosis present

## 2020-03-12 DIAGNOSIS — Z880 Allergy status to penicillin: Secondary | ICD-10-CM | POA: Diagnosis not present

## 2020-03-12 DIAGNOSIS — H11433 Conjunctival hyperemia, bilateral: Secondary | ICD-10-CM | POA: Diagnosis present

## 2020-03-12 DIAGNOSIS — J302 Other seasonal allergic rhinitis: Secondary | ICD-10-CM | POA: Diagnosis present

## 2020-03-12 DIAGNOSIS — Z20822 Contact with and (suspected) exposure to covid-19: Secondary | ICD-10-CM | POA: Diagnosis present

## 2020-03-12 DIAGNOSIS — Z7289 Other problems related to lifestyle: Secondary | ICD-10-CM | POA: Diagnosis not present

## 2020-03-12 DIAGNOSIS — B370 Candidal stomatitis: Secondary | ICD-10-CM | POA: Diagnosis present

## 2020-03-12 LAB — URINALYSIS, ROUTINE W REFLEX MICROSCOPIC
Bilirubin Urine: NEGATIVE
Glucose, UA: NEGATIVE mg/dL
Ketones, ur: 40 mg/dL — AB
Nitrite: NEGATIVE
Protein, ur: 30 mg/dL — AB
Specific Gravity, Urine: 1.02 (ref 1.005–1.030)
pH: 6 (ref 5.0–8.0)

## 2020-03-12 LAB — CBC WITH DIFFERENTIAL/PLATELET
Abs Immature Granulocytes: 0.04 10*3/uL (ref 0.00–0.07)
Basophils Absolute: 0 10*3/uL (ref 0.0–0.1)
Basophils Relative: 0 %
Eosinophils Absolute: 0.1 10*3/uL (ref 0.0–0.5)
Eosinophils Relative: 1 %
HCT: 41 % (ref 36.0–46.0)
Hemoglobin: 13.8 g/dL (ref 12.0–15.0)
Immature Granulocytes: 1 %
Lymphocytes Relative: 9 %
Lymphs Abs: 0.8 10*3/uL (ref 0.7–4.0)
MCH: 29.6 pg (ref 26.0–34.0)
MCHC: 33.7 g/dL (ref 30.0–36.0)
MCV: 87.8 fL (ref 80.0–100.0)
Monocytes Absolute: 0.7 10*3/uL (ref 0.1–1.0)
Monocytes Relative: 8 %
Neutro Abs: 7.1 10*3/uL (ref 1.7–7.7)
Neutrophils Relative %: 81 %
Platelets: 265 10*3/uL (ref 150–400)
RBC: 4.67 MIL/uL (ref 3.87–5.11)
RDW: 12.1 % (ref 11.5–15.5)
WBC: 8.7 10*3/uL (ref 4.0–10.5)
nRBC: 0 % (ref 0.0–0.2)

## 2020-03-12 LAB — SEDIMENTATION RATE: Sed Rate: 48 mm/hr — ABNORMAL HIGH (ref 0–22)

## 2020-03-12 LAB — WET PREP, GENITAL
Sperm: NONE SEEN
Trich, Wet Prep: NONE SEEN
Yeast Wet Prep HPF POC: NONE SEEN

## 2020-03-12 LAB — COMPREHENSIVE METABOLIC PANEL
ALT: 50 U/L — ABNORMAL HIGH (ref 0–44)
AST: 40 U/L (ref 15–41)
Albumin: 3.6 g/dL (ref 3.5–5.0)
Alkaline Phosphatase: 71 U/L (ref 38–126)
Anion gap: 12 (ref 5–15)
BUN: 17 mg/dL (ref 6–20)
CO2: 23 mmol/L (ref 22–32)
Calcium: 9.1 mg/dL (ref 8.9–10.3)
Chloride: 98 mmol/L (ref 98–111)
Creatinine, Ser: 1.05 mg/dL — ABNORMAL HIGH (ref 0.44–1.00)
GFR calc Af Amer: >60 mL/min (ref >60)
GFR calc non Af Amer: >60 mL/min (ref >60)
Glucose, Bld: 120 mg/dL — ABNORMAL HIGH (ref 70–99)
Potassium: 3.4 mmol/L — ABNORMAL LOW (ref 3.5–5.1)
Sodium: 133 mmol/L — ABNORMAL LOW (ref 135–145)
Total Bilirubin: 0.5 mg/dL (ref 0.3–1.2)
Total Protein: 8 g/dL (ref 6.5–8.1)

## 2020-03-12 LAB — URINALYSIS, MICROSCOPIC (REFLEX): RBC / HPF: >50 RBC/hpf (ref 0–5)

## 2020-03-12 LAB — CULTURE, GROUP A STREP (THRC)

## 2020-03-12 LAB — SARS CORONAVIRUS 2 BY RT PCR (HOSPITAL ORDER, PERFORMED IN ~~LOC~~ HOSPITAL LAB): SARS Coronavirus 2: NEGATIVE

## 2020-03-12 LAB — PREGNANCY, URINE: Preg Test, Ur: NEGATIVE

## 2020-03-12 LAB — LACTIC ACID, PLASMA: Lactic Acid, Venous: 1.3 mmol/L (ref 0.5–1.9)

## 2020-03-12 MED ORDER — SODIUM CHLORIDE 0.9 % IV SOLN: Freq: Once | INTRAVENOUS | Status: AC

## 2020-03-12 MED ORDER — DEXTROSE-NACL 5-0.45 % IV SOLN: INTRAVENOUS | Status: DC

## 2020-03-12 MED ORDER — DIPHENHYDRAMINE HCL 50 MG/ML IJ SOLN
25.0000 mg | Freq: Four times a day (QID) | INTRAMUSCULAR | Status: DC | PRN
  Administered 2020-03-12 – 2020-03-14 (×3): 25 mg via INTRAVENOUS
  Filled 2020-03-12 (×3): qty 1

## 2020-03-12 MED ORDER — PROMETHAZINE HCL 25 MG/ML IJ SOLN
12.5000 mg | Freq: Once | INTRAMUSCULAR | Status: AC
  Administered 2020-03-12: 12.5 mg via INTRAVENOUS

## 2020-03-12 MED ORDER — ONDANSETRON HCL 4 MG/2ML IJ SOLN: 4.0000 mg | Freq: Once | INTRAMUSCULAR | Status: AC

## 2020-03-12 MED ORDER — PROMETHAZINE HCL 25 MG/ML IJ SOLN
INTRAMUSCULAR | Status: AC
  Filled 2020-03-12: qty 1

## 2020-03-12 MED ORDER — FENTANYL CITRATE (PF) 100 MCG/2ML IJ SOLN
100.0000 ug | Freq: Once | INTRAMUSCULAR | Status: AC
  Administered 2020-03-12: 100 ug via INTRAVENOUS
  Filled 2020-03-12: qty 2

## 2020-03-12 MED ORDER — ONDANSETRON HCL 4 MG/2ML IJ SOLN
4.0000 mg | Freq: Four times a day (QID) | INTRAMUSCULAR | Status: DC | PRN
  Administered 2020-03-13 – 2020-03-14 (×3): 4 mg via INTRAVENOUS
  Filled 2020-03-12 (×3): qty 2

## 2020-03-12 MED ORDER — TETRACAINE HCL 0.5 % OP SOLN
2.0000 [drp] | Freq: Once | OPHTHALMIC | Status: AC
  Administered 2020-03-12: 2 [drp] via OPHTHALMIC
  Filled 2020-03-12: qty 4

## 2020-03-12 MED ORDER — FENTANYL CITRATE (PF) 100 MCG/2ML IJ SOLN
50.0000 ug | INTRAMUSCULAR | Status: DC | PRN
  Administered 2020-03-12 (×4): 50 ug via INTRAVENOUS
  Filled 2020-03-12 (×6): qty 2

## 2020-03-12 MED ORDER — DIPHENHYDRAMINE HCL 50 MG/ML IJ SOLN
25.0000 mg | INTRAMUSCULAR | Status: AC | PRN
  Administered 2020-03-12: 25 mg via INTRAVENOUS
  Filled 2020-03-12 (×3): qty 1

## 2020-03-12 MED ORDER — FENTANYL CITRATE (PF) 100 MCG/2ML IJ SOLN
25.0000 ug | INTRAMUSCULAR | Status: DC | PRN
  Administered 2020-03-12 – 2020-03-15 (×13): 25 ug via INTRAVENOUS
  Filled 2020-03-12 (×11): qty 2

## 2020-03-12 MED ORDER — ERYTHROMYCIN 5 MG/GM OP OINT
TOPICAL_OINTMENT | Freq: Four times a day (QID) | OPHTHALMIC | Status: DC
  Administered 2020-03-12: 1 via OPHTHALMIC
  Filled 2020-03-12 (×2): qty 3.5

## 2020-03-12 MED ORDER — SODIUM CHLORIDE 0.9 % IV SOLN: INTRAVENOUS | Status: DC | PRN

## 2020-03-12 MED ORDER — ONDANSETRON HCL 4 MG/2ML IJ SOLN
INTRAMUSCULAR | Status: AC
  Administered 2020-03-12: 4 mg
  Filled 2020-03-12: qty 2

## 2020-03-12 MED ORDER — ONDANSETRON HCL 4 MG/2ML IJ SOLN
4.0000 mg | Freq: Once | INTRAMUSCULAR | Status: AC
  Administered 2020-03-12: 4 mg via INTRAVENOUS
  Filled 2020-03-12: qty 2

## 2020-03-12 MED ORDER — FLUORESCEIN SODIUM 1 MG OP STRP
1.0000 | ORAL_STRIP | Freq: Once | OPHTHALMIC | Status: AC
  Administered 2020-03-12: 1 via OPHTHALMIC
  Filled 2020-03-12: qty 1

## 2020-03-12 MED ORDER — HEPARIN SODIUM (PORCINE) 5000 UNIT/ML IJ SOLN
5000.0000 [IU] | Freq: Three times a day (TID) | INTRAMUSCULAR | Status: DC
  Administered 2020-03-13 – 2020-03-14 (×4): 5000 [IU] via SUBCUTANEOUS
  Filled 2020-03-12 (×4): qty 1

## 2020-03-12 MED ORDER — SODIUM CHLORIDE 0.9 % IV BOLUS
1000.0000 mL | Freq: Once | INTRAVENOUS | Status: AC
  Administered 2020-03-12: 1000 mL via INTRAVENOUS

## 2020-03-12 MED ORDER — DIPHENHYDRAMINE HCL 50 MG/ML IJ SOLN
25.0000 mg | Freq: Once | INTRAMUSCULAR | Status: AC | PRN
  Administered 2020-03-12: 25 mg via INTRAVENOUS
  Filled 2020-03-12: qty 1

## 2020-03-12 MED ORDER — PANTOPRAZOLE SODIUM 40 MG IV SOLR
40.0000 mg | Freq: Once | INTRAVENOUS | Status: AC
  Administered 2020-03-12: 40 mg via INTRAVENOUS
  Filled 2020-03-12: qty 40

## 2020-03-12 MED ORDER — ONDANSETRON HCL 4 MG PO TABS
4.0000 mg | ORAL_TABLET | Freq: Four times a day (QID) | ORAL | Status: DC | PRN
  Administered 2020-03-13: 4 mg via ORAL
  Filled 2020-03-12: qty 1

## 2020-03-12 NOTE — Plan of Care (Signed)

## 2020-03-12 NOTE — H&P (Signed)
History and Physical    Rachel Compton RUE:[id number] DOB: 1991-02-12 DOA: 03/12/2020  PCP: Allegra GranaArnett, Margaret G, FNP  Patient coming from: Home.  Chief Complaint: Worsening rash and oral lesions.  HPI: Rachel Compton is a 29 y.o. female with no significant past medical history who was recently [place] started developing eye discharge on August 5 for which patient took over-the-counter medication.  Patient started having rash in the periphery of the upper extremities and some dysuria for which patient went to the urgent care and as per the patient patient was provided an injection and oral doxycycline.  This was on March 07, 2020.  Despite which patient's rash got worse and dysuria also got worse.  Patient again went to the ER on March 10, 2020 and was prescribed Macrobid Diflucan and Polytrim eyedrop.  Since patient symptoms were getting worse patient presents to the ER admits in [facility].  ED Course: In the ER patient is found to have chronic opacities in erythematous conjunctiva.  Has diffuse rash all over the body with deformed target lesions.  There is mucosal ulceration in the tongue and also ulceration of the lips.  Patient has burning sensation on swallowing.  Temperature was around 100 F with labs largely unremarkable except for creatinine of 1.05 potassium 3.4 and sodium 133 and Covid test was negative.  Patient in general picture was concerning for Stevens-Johnson syndrome.  Cause of which was not clear.  Since patient symptoms as per the patient was even present before she took on any of the antibiotics.  [facility].  As per the ER physician had discussed with [facility] who is [facility] patient on the wait list.  ER physician also discussed with Dr. Allena KatzPatel ophthalmologist who at this time requested transfer to tertiary care center pending which advised to give  erythromycin eyedrops every 6 hours.  Covid test was negative.  Review of Systems: As per HPI, rest all negative.   Past Medical History:  Diagnosis Date  . Asthma   . Chicken pox   . Migraine    04/22/08 MRI normal     Past Surgical History:  Procedure Laterality Date  . NO PAST SURGERIES       reports that she has never smoked. She has never used smokeless tobacco. She reports current alcohol use of about 5.0 standard drinks of alcohol per week. She reports that she does not use drugs.  Allergies  Allergen Reactions  . Penicillins Hives    Family History  Adopted: Yes  Problem Relation Age of Onset  . Cancer Neg Hx   . Diabetes Neg Hx   . Hyperlipidemia Neg Hx   . Hypertension Neg Hx   . Thyroid disease Neg Hx     Prior to Admission medications   Medication Sig Start Date End Date Taking? Authorizing Provider  Drospirenone-Ethinyl Estradiol-Levomefol (BEYAZ) 3-0.02-0.451 MG tablet Take 1 tablet by mouth daily. 08/10/19   [provider]  fluconazole (DIFLUCAN) 150 MG tablet Take 1 tablet (150 mg total) by mouth every other day. May repeat in 72 hours if needed 03/10/20   Hall-Potvin, [place], PA-C  lidocaine (XYLOCAINE) 2 % solution Use as directed 15 mLs in the mouth or throat as needed for mouth pain. 03/10/20   Hall-Potvin, [place], PA-C  montelukast (SINGULAIR) 10 MG tablet Take 1 tablet (10 mg total) by mouth at bedtime. 12/02/19  [name], FNP  nitrofurantoin, macrocrystal-monohydrate, (MACROBID) 100 MG capsule Take 1 capsule (100 mg total) by mouth 2 (two) times daily. 03/10/20   Hall-Potvin, [place], PA-C  sulfacetamide (BLEPH-10) 10 % ophthalmic solution  03/08/20   [provider]  SUMAtriptan (IMITREX) 50 MG tablet TAKE 1 TABLET BY MOUTH EVERY 2 HOURS AS NEEDED FOR MIGRAINE. MAY REPEAT IN 2 HOURS IF HEADACHE PERSISTS. MAXIMUM DAILY DOSE IS 4 TABLETS 07/15/19   [name], FNP  trimethoprim-polymyxin b (POLYTRIM) ophthalmic  solution Place 1 drop into the left eye every 4 (four) hours. 03/10/20   Hall-Potvin, [place], PA-C  valACYclovir (VALTREX) 500 MG tablet Take 500 mg by mouth 2 (two) times daily.    [provider]  fexofenadine (ALLEGRA ALLERGY) 60 MG tablet Take 1 tablet (60 mg total) by mouth 2 (two) times daily. 12/02/19 03/11/20  [name], FNP    Physical Exam: Constitutional: Moderately built and nourished. Vitals:   03/12/20 1622 03/12/20 1807 03/12/20 1931 03/12/20 2131  BP: 117/85 122/82 123/73 109/69  Pulse: 98 94 88 94  Resp: 20 16 18 18   Temp:    100.2 F (37.9 C)  TempSrc:    Oral  SpO2: 100% 98% 99% 97%  Weight:      Height:       Eyes: Conjunctive is injected.  Cornea appears opaque. ENMT: Patient has multiple ulcerations on the lips upper and lower.  And thrush appearing lesions on the tongue. Neck: No neck rigidity. Respiratory: No rhonchi or crepitations. Cardiovascular: S1-S2 heard. Abdomen: Soft nontender bowel sounds present. Musculoskeletal: No edema. Skin: Diffuse papular lesions measuring less than 1 cm all over the body appears deformed targetoid lesions. Neurologic: Alert awake oriented to time place and person.  Moves all extremities. Psychiatric: Appears normal.  Normal affect.   Labs on Admission: I have personally reviewed following labs and imaging studies  CBC: Recent Labs  Lab 03/12/20 0244  WBC 8.7  NEUTROABS 7.1  HGB 13.8  HCT 41.0  MCV 87.8  PLT 265   Basic Metabolic Panel: Recent Labs  Lab 03/12/20 0244  NA 133*  K 3.4*  CL 98  CO2 23  GLUCOSE 120*  BUN 17  CREATININE 1.05*  CALCIUM 9.1   GFR: Estimated Creatinine Clearance: 84.8 mL/min (A) (by C-G formula based on SCr of 1.05 mg/dL (H)). Liver Function Tests: Recent Labs  Lab 03/12/20 0244  AST 40  ALT 50*  ALKPHOS 71  BILITOT 0.5  PROT 8.0  ALBUMIN 3.6   No results for input(s): LIPASE, AMYLASE in the last 168 hours. No results for input(s): AMMONIA in the  last 168 hours. Coagulation Profile: No results for input(s): INR, PROTIME in the last 168 hours. Cardiac Enzymes: No results for input(s): CKTOTAL, CKMB, CKMBINDEX, TROPONINI in the last 168 hours. BNP (last 3 results) No results for input(s): PROBNP in the last 8760 hours. HbA1C: No results for input(s): HGBA1C in the last 72 hours. CBG: No results for input(s): GLUCAP in the last 168 hours. Lipid Profile: No results for input(s): CHOL, HDL, LDLCALC, TRIG, CHOLHDL, LDLDIRECT in the last 72 hours. Thyroid Function Tests: No results for input(s): TSH, T4TOTAL, FREET4, T3FREE, THYROIDAB in the last 72 hours. Anemia Panel: No results for input(s): VITAMINB12, FOLATE, FERRITIN, TIBC, IRON, RETICCTPCT in the last 72 hours. Urine analysis:    Component Value Date/Time   COLORURINE YELLOW 03/12/2020 0244   APPEARANCEUR CLOUDY (A) 03/12/2020 0244   LABSPEC 1.020 03/12/2020 0244  PHURINE 6.0 03/12/2020 0244   GLUCOSEU NEGATIVE 03/12/2020 0244   HGBUR LARGE (A) 03/12/2020 0244   BILIRUBINUR NEGATIVE 03/12/2020 0244   BILIRUBINUR large (A) 03/10/2020 0949   KETONESUR 40 (A) 03/12/2020 0244   PROTEINUR 30 (A) 03/12/2020 0244   UROBILINOGEN >=8.0 (A) 03/10/2020 0949   NITRITE NEGATIVE 03/12/2020 0244   LEUKOCYTESUR MODERATE (A) 03/12/2020 0244   Sepsis Labs: @LABRCNTIP (procalcitonin:4,lacticidven:4) ) Recent Results (from the past 240 hour(s))  Urine Culture     Status: None   Collection Time: 03/10/20 10:04 AM   Specimen: Urine, Clean Catch  Result Value Ref Range Status   Specimen Description URINE, CLEAN CATCH  Final   Special Requests NONE  Final   Culture   Final    NO GROWTH Performed at [facility], [address], [place], [place]    Report Status 03/11/2020 FINAL  Final  Culture, group A strep     Status: None   Collection Time: 03/10/20 10:04 AM   Specimen: Throat  Result Value Ref Range Status   Specimen Description THROAT  Final   Special  Requests NONE  Final   Culture   Final    NO GROUP A STREP (S.PYOGENES) ISOLATED Performed at [facility], [address] [place], [place]    Report Status 03/12/2020 FINAL  Final  SARS Coronavirus 2 by RT PCR (hospital order, performed in [facility] hospital lab) Nasopharyngeal Nasopharyngeal Swab     Status: None   Collection Time: 03/12/20  2:44 AM   Specimen: Nasopharyngeal Swab  Result Value Ref Range Status   SARS Coronavirus 2 NEGATIVE NEGATIVE Final    Comment: (NOTE) SARS-CoV-2 target nucleic acids are NOT DETECTED.  The SARS-CoV-2 RNA is generally detectable in upper and lower respiratory specimens during the acute phase of infection. The lowest concentration of SARS-CoV-2 viral copies this assay can detect is 250 copies / mL. A negative result does not preclude SARS-CoV-2 infection and should not be used as the sole basis for treatment or other patient management decisions.  A negative result may occur with improper specimen collection / handling, submission of specimen other than nasopharyngeal swab, presence of viral mutation(s) within the areas targeted by this assay, and inadequate number of viral copies (<250 copies / mL). A negative result must be combined with clinical observations, patient history, and epidemiological information.  Fact Sheet for Patients:   05/12/20  Fact Sheet for Healthcare Providers: BoilerBrush.com.cy  This test is not yet approved or  cleared by the https://pope.com/ FDA and has been authorized for detection and/or diagnosis of SARS-CoV-2 by FDA under an Emergency Use Authorization (EUA).  This EUA will remain in effect (meaning this test can be used) for the duration of the COVID-19 declaration under Section 564(b)(1) of the Act, 21 U.S.C. section 360bbb-3(b)(1), unless the authorization is terminated or revoked sooner.  Performed at [facility], [address] [place], [place]   Wet prep, genital     Status: Abnormal   Collection Time: 03/12/20  2:50 PM   Specimen: PATH Cytology Cervicovaginal Ancillary Only  Result Value Ref Range Status   Yeast Wet Prep HPF POC NONE SEEN NONE SEEN Final   Trich, Wet Prep NONE SEEN NONE SEEN Final   Clue Cells Wet Prep HPF POC PRESENT (A) NONE SEEN Final   WBC, Wet Prep HPF POC MANY (A) NONE SEEN Final   Sperm NONE SEEN  Final  Comment: Performed at [facility], [address] [place], [place] [address]     Radiological Exams on Admission: DG Chest [facility] 1 View  Result Date: 03/12/2020 CLINICAL DATA:  Generalized fatigue with nausea and vomiting for 3 days. EXAM: PORTABLE CHEST 1 VIEW COMPARISON:  None. FINDINGS: The cardiac silhouette, mediastinal and hilar contours are normal. Low lung volumes with streaky bibasilar atelectasis. No infiltrates or effusions. No pulmonary lesions. The bony thorax is intact. IMPRESSION: Low lung volumes with streaky bibasilar atelectasis. No definite infiltrates or effusions. Electronically Signed   By: Rudie Meyer M.D.   On: 03/12/2020 06:22     Assessment/Plan Principal Problem:   Stevens-Johnson syndrome (HCC)    1. 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  Cause not clear.  As patient states her symptoms started even before she took the antibiotics for possible UTI.  Will give supportive care at this time with IV fluids and as needed IV Benadryl for itching.  Patient symptoms have been present for more than 3 days so not sure if cyclosporine will be effectively however try to reach dermatologist to get more opinion.  Patient has been accepted by [facility] and has been waitlisted.  Will need tertiary care center transfer as soon as possible.  Since patient is febrile and at the time of my exam was having chills and rigors I have started patient on empiric antibiotics.  Follow cultures GC probe RPR and HIV.  Sed  rate is found to be elevated.  Given the patient has severe diffuse rash with concerns for Stevens-Johnson syndrome will need inpatient status.   DVT prophylaxis: Heparin. Code Status: Full code. Family Communication: Discussed with patient. Disposition Plan: To be determined. Consults called: None. Admission status: Inpatient.   Eduard Clos MD [facility] Pager [phone].  If 7PM-7AM, please contact night-coverage [email or web address] Password [facility]  03/12/2020, 11:41 PM

## 2020-03-12 NOTE — ED Notes (Signed)
C/o nausea, retching currently , EDP informed

## 2020-03-12 NOTE — ED Notes (Signed)
Pt calls this rn to room, states the fentanyl helped her general pain, but her eyes are burning and itching "it feels like there is something happening in there." pt states she was rx atbx eye gtts at urgent care and wonders if she can have a dose. Dr. Donnald Garre informed, states she will need to look at our pharmacy list and get back to me with orders.

## 2020-03-12 NOTE — ED Notes (Signed)
ED Provider at bedside. 

## 2020-03-12 NOTE — ED Provider Notes (Signed)
  Physical Exam  BP 117/85 (BP Location: Left Arm)   Pulse 98   Temp 99.1 F (37.3 C) (Oral)   Resp 20   Ht 5\' 8"  (1.727 m)   Wt 77.1 kg   LMP 03/11/2020 (LMP Unknown)   SpO2 100%   BMI 25.85 kg/m   Physical Exam  ED Course/Procedures     Procedures  MDM  Patient is admitted for 05/11/2020 syndrome.  Patient does have corneal involvement as well.  Dr. Trudie Buckler called Dr. Clarice Pole from ophthalmology earlier.  Dr. Allena Katz is able to review the images now and recommend erythromycin every 6 hours for now.  Patient will need outpatient ophthalmology follow-up.  He again recommends transfer to tertiary care center.  I told him that we are pending a bed at [facility] for now and if there is a bed in the tertiary care center such as [facility], we can transfer patient.     [facility], MD 03/12/20 [id number]

## 2020-03-12 NOTE — ED Notes (Signed)
Cont to await for room assignment and transport to [facility]

## 2020-03-12 NOTE — ED Notes (Signed)
HUSBAND HAS ALL OF PATIENTS BELONGINGS AND TAKING THEM HOME

## 2020-03-12 NOTE — ED Notes (Signed)
Unable to perform visual acuity, was unable to open eyes until tetracaine gtts were instilled by EDP. Also c/o pain in eyes and having blurred vision, drainage noted from eyes as well and was observed by EDP

## 2020-03-12 NOTE — ED Notes (Signed)
GIVEN FENTANYL IV, CARELINK AT BEDSIDE

## 2020-03-12 NOTE — ED Notes (Signed)
Pt states her pain and itching are returning, requests pain meds. Prn orders noted.

## 2020-03-12 NOTE — ED Notes (Signed)
Pt given warm washcloth to hold on her eyelids. Reports some relief.

## 2020-03-12 NOTE — ED Provider Notes (Signed)
I have assumed care for Dr. Read Drivers.  Plan is for patient admission.  However, there has been no bed availability at tertiary care specialty centers.  [facility] advised they are 3 days out from any bed availability.  Physical Exam  BP (!) 95/59 (BP Location: Left Arm)   Pulse 86   Temp 99.1 F (37.3 C) (Oral)   Resp 18   Ht 5\' 8"  (1.727 m)   Wt 77.1 kg   LMP 03/11/2020 (LMP Unknown)   SpO2 98%   BMI 25.85 kg/m   Physical Exam Constitutional:      Comments: Patient is alert with clear mental status.  No confusion.  No respiratory distress.  HENT:     Mouth/Throat:     Comments: Mucous membranes continue to be similar to images in prior chart. Eyes:     Comments: I have performed fluorescein staining and images are attached.  Cardiovascular:     Rate and Rhythm: Normal rate and regular rhythm.  Pulmonary:     Breath sounds: Normal breath sounds.  Genitourinary:    Comments: Surface is of vagina are inflamed and red.  Modest amount of whitish discharge around the vaginal labia.  Cervix has some lesions, flat and erythematous.  No drainage from the cervix. Musculoskeletal:        General: Normal range of motion.  Skin:    General: Skin is warm and dry.     Comments: Rash does not appear to have progressed over skin surfaces as compared to prior images and Dr. 05/11/2020 note.  Neurological:     General: No focal deficit present.     Mental Status: She is oriented to person, place, and time.     Coordination: Coordination normal.           ED Course/Procedures     Procedures  MDM  Consult: I have reviewed the case with Dr. Thayer Ohm of ophthalmology.  He will examined the images in the chart to see if there are additional ophthalmologic recommendations.  Consult: Reviewed Dr. Allena Katz.  Accepts for admission.   Patient has been in the emergency department all day awaiting bed placement.  We have tried to recontact [facility] and [facility].  Still no prospect for bed  availability.  Patient is stable for medical management at this time.  Her body rash has not progressed.  No issues with sloughing or full body dermal rash.  Symptoms most distressing to patient is burning and itching of her eyes.  Further examination as outlined in consultation ophthalmology placed.  I have also added eye cultures and GC chlamydia cultures both pelvic and ocular.  Patient has had negative HIV and negative RPR from 2 days ago.  Patient's working diagnosis is Stevens-Johnson.  Patient will benefit from inpatient management at [facility] both for comfort and other consultations.  May consider ID consult for other systemic etiologies.  Patient has however been on multiple antibiotics over the course of the past several days.  Patient has no headache, no neck stiffness and has not been febrile while in the emergency department.  Blood pressures are stable.  At this time, patient does not exhibit signs of sepsis.  Blood cultures have been added as well.       [facility], MD 03/12/20 [phone]

## 2020-03-12 NOTE — ED Provider Notes (Signed)
[facility] Provider Note: Lowella Dell, MD, FACEP  CSN: [id number] MRN: [id number] ARRIVAL: 03/11/20 at 2208 ROOM: MH09/MH09   CHIEF COMPLAINT  Rash   HISTORY OF PRESENT ILLNESS  03/12/20 2:03 AM Rachel Compton is a 29 y.o. female who was sent here from a [place] urgent care yesterday after being seen for "5-day history of rash, sore throat, eye redness, urinary symptoms.  She was first seen in the ED and was started on doxycycline for urinary symptoms.  Possible Rocephin injection at the time as well.  She was seen yesterday at urgent care due to continued symptoms.  Rash was originally to forearm and mouth, has since spread to trunk.  She was found to have diffuse thrush in the mouth and given Diflucan.  Also switched doxycycline to Macrobid.  Came in today due to a rash spreading to the foot and found febrile.  RPR/HIV yesterday negative."  Urine culture done on 03/10/2020 grew out no organisms.  She was noted to have a temperature of 102.6 in the urgent care which was 99.7 on arrival here.  The patient states she is here because of the persistent rash, which is generalized and pruritic, as well as persistent irritation of the eyes and mouth.  She states her eyes and mouth are very painful, which she rates as a 10 out of 10.  The pain is burning in nature and worse with eating or drinking (she has had little oral intake since this began).  She is also having burning with urination.  She has had nasal congestion but denies any other URI symptoms such as cough or shortness of breath.  She has had generalized fatigue.  Past Medical History:  Diagnosis Date  . Asthma   . Chicken pox   . Migraine    04/22/08 MRI normal     Past Surgical History:  Procedure Laterality Date  . NO PAST SURGERIES      Family History  Adopted: Yes  Problem Relation Age of Onset  . Cancer Neg Hx   . Diabetes Neg Hx   . Hyperlipidemia Neg Hx   . Hypertension Neg Hx   . Thyroid  disease Neg Hx     Social History   Tobacco Use  . Smoking status: Never Smoker  . Smokeless tobacco: Never Used  Vaping Use  . Vaping Use: Former  Substance Use Topics  . Alcohol use: Yes    Alcohol/week: 5.0 standard drinks    Types: 5 Standard drinks or equivalent per week  . Drug use: No    Prior to Admission medications   Medication Sig Start Date End Date Taking? Authorizing Provider  Drospirenone-Ethinyl Estradiol-Levomefol (BEYAZ) 3-0.02-0.451 MG tablet Take 1 tablet by mouth daily. 08/10/19   [provider]  fluconazole (DIFLUCAN) 150 MG tablet Take 1 tablet (150 mg total) by mouth every other day. May repeat in 72 hours if needed 03/10/20   Hall-Potvin, [place], PA-C  lidocaine (XYLOCAINE) 2 % solution Use as directed 15 mLs in the mouth or throat as needed for mouth pain. 03/10/20   Hall-Potvin, [place], PA-C  montelukast (SINGULAIR) 10 MG tablet Take 1 tablet (10 mg total) by mouth at bedtime. 12/02/19   [name], FNP  nitrofurantoin, macrocrystal-monohydrate, (MACROBID) 100 MG capsule Take 1 capsule (100 mg total) by mouth 2 (two) times daily. 03/10/20   Hall-Potvin, [place], PA-C  sulfacetamide (BLEPH-10) 10 % ophthalmic solution  03/08/20   [provider]  SUMAtriptan Willette Brace)  50 MG tablet TAKE 1 TABLET BY MOUTH EVERY 2 HOURS AS NEEDED FOR MIGRAINE. MAY REPEAT IN 2 HOURS IF HEADACHE PERSISTS. MAXIMUM DAILY DOSE IS 4 TABLETS 07/15/19   [name], FNP  trimethoprim-polymyxin b (POLYTRIM) ophthalmic solution Place 1 drop into the left eye every 4 (four) hours. 03/10/20   Hall-Potvin, [place], PA-C  valACYclovir (VALTREX) 500 MG tablet Take 500 mg by mouth 2 (two) times daily.    [provider]  fexofenadine (ALLEGRA ALLERGY) 60 MG tablet Take 1 tablet (60 mg total) by mouth 2 (two) times daily. 12/02/19 03/11/20  [name], FNP    Allergies Penicillins   REVIEW OF SYSTEMS  Negative except as noted here or in the History  of Present Illness.   PHYSICAL EXAMINATION  Initial Vital Signs Blood pressure 118/78, pulse 98, temperature 99.7 F (37.6 C), temperature source Oral, resp. rate 19, height 5\' 8"  (1.727 m), weight 77.1 kg, last menstrual period 03/11/2020, SpO2 99 %.  Examination General: Well-developed, well-nourished female in no acute distress; appearance consistent with age of record HENT: normocephalic; inflammation and crusting of the conjunctivae and oral mucosae;      Eyes: pupils equal, round and reactive to light; extraocular muscles intact Neck: supple Heart: regular rate and rhythm Lungs: clear to auscultation bilaterally Abdomen: soft; nondistended; nontender; bowel sounds present Extremities: No deformity; full range of motion; pulses normal Neurologic: Awake, alert and oriented; motor function intact in all extremities and symmetric; no facial droop Skin: Warm and dry; generalized target-like rash:        Psychiatric: Flat affect   RESULTS  Summary of this visit's results, reviewed and interpreted by myself:   EKG Interpretation  Date/Time:    Ventricular Rate:    PR Interval:    QRS Duration:   QT Interval:    QTC Calculation:   R Axis:     Text Interpretation:        Laboratory Studies: Results for orders placed or performed during the hospital encounter of 03/12/20 (from the past 24 hour(s))  CBC with Differential/Platelet     Status: None   Collection Time: 03/12/20  2:44 AM  Result Value Ref Range   WBC 8.7 4.0 - 10.5 K/uL   RBC 4.67 3.87 - 5.11 MIL/uL   Hemoglobin 13.8 12.0 - 15.0 g/dL   HCT 05/12/20 36 - 46 %   MCV 87.8 80.0 - 100.0 fL   MCH 29.6 26.0 - 34.0 pg   MCHC 33.7 30.0 - 36.0 g/dL   RDW 94.8 01.6 - 55.3 %   Platelets 265 150 - 400 K/uL   nRBC 0.0 0.0 - 0.2 %   Neutrophils Relative % 81 %   Neutro Abs 7.1 1.7 - 7.7 K/uL   Lymphocytes Relative 9 %   Lymphs Abs 0.8 0.7 - 4.0 K/uL   Monocytes Relative 8 %   Monocytes Absolute 0.7 0 - 1 K/uL    Eosinophils Relative 1 %   Eosinophils Absolute 0.1 0 - 0 K/uL   Basophils Relative 0 %   Basophils Absolute 0.0 0 - 0 K/uL   Immature Granulocytes 1 %   Abs Immature Granulocytes 0.04 0.00 - 0.07 K/uL  Comprehensive metabolic panel     Status: Abnormal   Collection Time: 03/12/20  2:44 AM  Result Value Ref Range   Sodium 133 (L) 135 - 145 mmol/L   Potassium 3.4 (L) 3.5 - 5.1 mmol/L   Chloride 98 98 - 111 mmol/L  CO2 23 22 - 32 mmol/L   Glucose, Bld 120 (H) 70 - 99 mg/dL   BUN 17 6 - 20 mg/dL   Creatinine, Ser 1.611.05 (H) 0.44 - 1.00 mg/dL   Calcium 9.1 8.9 - 09.610.3 mg/dL   Total Protein 8.0 6.5 - 8.1 g/dL   Albumin 3.6 3.5 - 5.0 g/dL   AST 40 15 - 41 U/L   ALT 50 (H) 0 - 44 U/L   Alkaline Phosphatase 71 38 - 126 U/L   Total Bilirubin 0.5 0.3 - 1.2 mg/dL   GFR calc non Af Amer >60 >60 mL/min   GFR calc Af Amer >60 >60 mL/min   Anion gap 12 5 - 15  SARS Coronavirus 2 by RT PCR (hospital order, performed in [facility] hospital lab) Nasopharyngeal Nasopharyngeal Swab     Status: None   Collection Time: 03/12/20  2:44 AM   Specimen: Nasopharyngeal Swab  Result Value Ref Range   SARS Coronavirus 2 NEGATIVE NEGATIVE   Imaging Studies: No results found.  ED COURSE and MDM  Nursing notes, initial and subsequent vitals signs, including pulse oximetry, reviewed and interpreted by myself.  Vitals:   03/12/20 0300 03/12/20 0330 03/12/20 0400 03/12/20 0500  BP: 107/76 110/77 108/76 112/66  Pulse: 80 79 83 79  Resp: 20 16 16 16   Temp: 99.4 F (37.4 C)     TempSrc:      SpO2: 96% 97% 96% 95%  Weight:      Height:       Medications  fentaNYL (SUBLIMAZE) injection 50 mcg (has no administration in time range)  diphenhydrAMINE (BENADRYL) injection 25 mg (has no administration in time range)  sodium chloride 0.9 % bolus 1,000 mL (0 mLs Intravenous Stopped 03/12/20 0351)  ondansetron (ZOFRAN) injection 4 mg (4 mg Intravenous Given 03/12/20 0239)  fentaNYL (SUBLIMAZE) injection 100 mcg  (100 mcg Intravenous Given 03/12/20 0239)  diphenhydrAMINE (BENADRYL) injection 25 mg (25 mg Intravenous Given 03/12/20 0348)  0.9 %  sodium chloride infusion ( Intravenous New Bag/Given 03/12/20 0349)  pantoprazole (PROTONIX) injection 40 mg (40 mg Intravenous Given 03/12/20 0317)  fentaNYL (SUBLIMAZE) injection 100 mcg (100 mcg Intravenous Given 03/12/20 0348)    2:21 AM The patient's presentation is consistent with Stevens-Johnson syndrome.  She does not appear to have toxic epidermal necrolysis.  An IV fluid bolus has been initiated and we will treat her pain and itching.  The cause of the Stevens-Johnson is unclear.  Although she was seen in the urgent care on 03/10/2020 and given Diflucan, Macrobid and Polytrim eyedrops, these were all given to treat the Stevens-Johnson.  The visit note for 03/10/2020 indicates she had a recent history of strep and the strep or treatment thereof may have triggered the Stevens-Johnson syndrome.  5:51 AM Discussed with Dr. Antionette Charpyd of [facility] hospitalist.  He is unable to accept the [facility] to the [facility] because there are no dermatologist on call for consultation.  [facility] which cannot [facility] the patient due to a 3-day waiting list.  [facility], [facility] burn service would accept the patient in consultation with hospitalist admission, but no hospitalist beds are available.  Awaiting discussion with [facility].  6:37 AM Discussed with Dr. Rosina LowensteinArthur Taylor[facility].  He is willing to place the patient on a wait list but they are otherwise full and do not know when they will have a bed available.  PROCEDURES  Procedures  CRITICAL CARE  Performed by: Carlisle Beers Markela Wee Total critical care time: 90 minutes Critical care time was exclusive of separately billable procedures and treating other patients. Critical care was necessary to treat or prevent imminent or life-threatening  deterioration. Critical care was time spent personally by me on the following activities: development of treatment plan with patient and/or surrogate as well as nursing, discussions with consultants, evaluation of patient's response to treatment, examination of patient, obtaining history from patient or surrogate, ordering and performing treatments and interventions, ordering and review of laboratory studies, ordering and review of radiographic studies, pulse oximetry and re-evaluation of patient's condition.   ED DIAGNOSES     ICD-10-CM   1. Stevens-Johnson syndrome (HCC)  L51.1        Teala Daffron, MD 03/12/20 2235

## 2020-03-12 NOTE — ED Notes (Signed)
Pt and companion updated on plan of care, no beds available at [facility], or [facility]. Possible transfer to [facility] ED later this afternoon. Pt and companion verbalize understanding. Companion noted sleeping in chair, geri chair placed at bedside, pt assisted to recline, pillow and warm blankets provided.

## 2020-03-12 NOTE — ED Notes (Signed)
Having nausea and vomiting, vomited x 2 large amt of thick sec type emesis, order rec for Zofran IV

## 2020-03-12 NOTE — ED Notes (Signed)
Safety measures remain in place, spouse at bedside, cont to await room assignment

## 2020-03-13 ENCOUNTER — Inpatient Hospital Stay: Payer: Self-pay

## 2020-03-13 LAB — CBC
HCT: 37.8 % (ref 36.0–46.0)
Hemoglobin: 12.5 g/dL (ref 12.0–15.0)
MCH: 29.3 pg (ref 26.0–34.0)
MCHC: 33.1 g/dL (ref 30.0–36.0)
MCV: 88.7 fL (ref 80.0–100.0)
Platelets: 219 10*3/uL (ref 150–400)
RBC: 4.26 MIL/uL (ref 3.87–5.11)
RDW: 12.2 % (ref 11.5–15.5)
WBC: 8.3 10*3/uL (ref 4.0–10.5)
nRBC: 0 % (ref 0.0–0.2)

## 2020-03-13 LAB — BASIC METABOLIC PANEL
Anion gap: 10 (ref 5–15)
BUN: 9 mg/dL (ref 6–20)
CO2: 22 mmol/L (ref 22–32)
Calcium: 8.5 mg/dL — ABNORMAL LOW (ref 8.9–10.3)
Chloride: 105 mmol/L (ref 98–111)
Creatinine, Ser: 1 mg/dL (ref 0.44–1.00)
GFR calc Af Amer: >60 mL/min (ref >60)
GFR calc non Af Amer: >60 mL/min (ref >60)
Glucose, Bld: 130 mg/dL — ABNORMAL HIGH (ref 70–99)
Potassium: 3.7 mmol/L (ref 3.5–5.1)
Sodium: 137 mmol/L (ref 135–145)

## 2020-03-13 LAB — HEPATIC FUNCTION PANEL
ALT: 37 U/L (ref 0–44)
AST: 28 U/L (ref 15–41)
Albumin: 2.7 g/dL — ABNORMAL LOW (ref 3.5–5.0)
Alkaline Phosphatase: 52 U/L (ref 38–126)
Bilirubin, Direct: 0.2 mg/dL (ref 0.0–0.2)
Indirect Bilirubin: 0.5 mg/dL (ref 0.3–0.9)
Total Bilirubin: 0.7 mg/dL (ref 0.3–1.2)
Total Protein: 6.4 g/dL — ABNORMAL LOW (ref 6.5–8.1)

## 2020-03-13 LAB — GC/CHLAMYDIA PROBE AMP (~~LOC~~) NOT AT ARMC
Chlamydia: NEGATIVE
Chlamydia: NEGATIVE
Comment: NEGATIVE
Comment: NORMAL
Comment: NEGATIVE
Comment: NORMAL
Neisseria Gonorrhea: NEGATIVE
Neisseria Gonorrhea: NEGATIVE

## 2020-03-13 MED ORDER — EYE WASH OPHTH SOLN
1.0000 [drp] | OPHTHALMIC | Status: DC | PRN
  Filled 2020-03-13 (×2): qty 118

## 2020-03-13 MED ORDER — PREDNISOLONE ACETATE 1 % OP SUSP
1.0000 [drp] | Freq: Four times a day (QID) | OPHTHALMIC | Status: DC
  Administered 2020-03-13 – 2020-03-14 (×4): 1 [drp] via OPHTHALMIC
  Filled 2020-03-13: qty 5

## 2020-03-13 MED ORDER — VANCOMYCIN HCL 1250 MG/250ML IV SOLN
1250.0000 mg | Freq: Two times a day (BID) | INTRAVENOUS | Status: DC
  Administered 2020-03-13: 1250 mg via INTRAVENOUS
  Filled 2020-03-13 (×2): qty 250

## 2020-03-13 MED ORDER — ACETAMINOPHEN 650 MG RE SUPP
650.0000 mg | RECTAL | Status: DC | PRN
  Administered 2020-03-13: 650 mg via RECTAL
  Filled 2020-03-13: qty 1

## 2020-03-13 MED ORDER — ACETAMINOPHEN 325 MG PO TABS
650.0000 mg | ORAL_TABLET | Freq: Four times a day (QID) | ORAL | Status: DC | PRN
  Administered 2020-03-13: 650 mg via ORAL
  Filled 2020-03-13: qty 2

## 2020-03-13 MED ORDER — PROMETHAZINE HCL 25 MG/ML IJ SOLN
25.0000 mg | Freq: Four times a day (QID) | INTRAMUSCULAR | Status: DC | PRN
  Administered 2020-03-13 – 2020-03-14 (×3): 25 mg via INTRAVENOUS
  Filled 2020-03-13 (×3): qty 1

## 2020-03-13 MED ORDER — METHYLPREDNISOLONE SODIUM SUCC 125 MG IJ SOLR
40.0000 mg | Freq: Two times a day (BID) | INTRAMUSCULAR | Status: DC
  Administered 2020-03-13 – 2020-03-14 (×3): 40 mg via INTRAVENOUS
  Filled 2020-03-13 (×3): qty 2

## 2020-03-13 MED ORDER — WHITE PETROLATUM EX OINT
TOPICAL_OINTMENT | CUTANEOUS | Status: AC
  Filled 2020-03-13: qty 28.35

## 2020-03-13 MED ORDER — WHITE PETROLATUM EX OINT
TOPICAL_OINTMENT | CUTANEOUS | Status: DC | PRN
  Administered 2020-03-13: 0.2 via TOPICAL
  Filled 2020-03-13 (×2): qty 28.35

## 2020-03-13 MED ORDER — GATIFLOXACIN 0.5 % OP SOLN
1.0000 [drp] | Freq: Four times a day (QID) | OPHTHALMIC | Status: DC
  Administered 2020-03-13 – 2020-03-14 (×4): 1 [drp] via OPHTHALMIC
  Filled 2020-03-13: qty 2.5

## 2020-03-13 MED ORDER — SODIUM CHLORIDE 0.9 % IV SOLN
2.0000 g | Freq: Three times a day (TID) | INTRAVENOUS | Status: DC
  Administered 2020-03-13 (×2): 2 g via INTRAVENOUS
  Filled 2020-03-13 (×4): qty 2

## 2020-03-13 MED ORDER — VANCOMYCIN HCL 1500 MG/300ML IV SOLN
1500.0000 mg | Freq: Once | INTRAVENOUS | Status: AC
  Administered 2020-03-13: 1500 mg via INTRAVENOUS
  Filled 2020-03-13: qty 300

## 2020-03-13 NOTE — Progress Notes (Addendum)
   03/13/20 1704  Assess: MEWS Score  Temp (!) 102.4 F (39.1 C)  BP 110/71  Pulse Rate 86 (86)  Resp 16  Assess: MEWS Score  MEWS Temp 2  MEWS Systolic 0  MEWS Pulse 0  MEWS RR 0  MEWS LOC 0  MEWS Score 2  MEWS Score Color Yellow  Assess: if the MEWS score is Yellow or Red  Were vital signs taken at a resting state? Yes  Treat  MEWS Interventions Administered prn meds/treatments;Other (Comment) (Rapid respone)  Pain Scale 0-10  Pain Score 1  Pain Type Acute pain  Pain Location Mouth  Pain Descriptors / Indicators Burning  Pain Intervention(s) Cold applied  Multiple Pain Sites No  Take Vital Signs  Increase Vital Sign Frequency  Yellow: Q 2hr X 2 then Q 4hr X 2, if remains yellow, continue Q 4hrs  Escalate  MEWS: Escalate Yellow: discuss with charge nurse/RN and consider discussing with provider and RRT  Notify: Charge Nurse/RN  Name of Charge Nurse/RN Notified Marissa  Date Charge Nurse/RN Notified 03/13/20  Time Charge Nurse/RN Notified 1710  Notify: Provider  Provider Name/Title Dr. Lucianne Muss  Date Provider Notified 03/13/20  Time Provider Notified 1709  Notification Type Call  Notification Reason Change in status  Response See new orders;Other (Comment)  Date of Provider Response 03/06/20 (Dr Lucianne Muss assessed)  Time of Provider Response 1715  Notify: Rapid Response  Date Rapid Response Notified 03/13/20  Time Rapid Response Notified 1700  Document  Patient Outcome Transferred/level of care increased  Progress note created (see row info) Yes   Dr. Lucianne Muss notified of yellow MEWS, RRT at bedside. Orders received to transfer .

## 2020-03-13 NOTE — Plan of Care (Signed)
?  Problem: Education: ?Goal: Knowledge of General Education information will improve ?Description: Including pain rating scale, medication(s)/side effects and non-pharmacologic comfort measures ?Outcome: Progressing ?  ?Problem: Clinical Measurements: ?Goal: Respiratory complications will improve ?Outcome: Progressing ?Goal: Cardiovascular complication will be avoided ?Outcome: Progressing ?  ?Problem: Pain Managment: ?Goal: General experience of comfort will improve ?Outcome: Progressing ?  ?

## 2020-03-13 NOTE — Progress Notes (Signed)
PROGRESS NOTE    Rachel Compton  WGN:[id number] DOB: 05/07/1991 DOA: 03/12/2020 PCP: [name], FNP   Brief Narrative:  Rachel Compton is a 29 y.o. female with no significant past medical history who has recently traveled to [place] with the family started developing eye discharge on August 5 for which patient took over-the-counter medications.  Patient started having rash in the periphery of the upper extremities and some dysuria for which patient went to the urgent care and as per the patient , she was provided an injection and oral doxycycline. This was on March 07, 2020.  Despite which patient's rash got worse and dysuria also got worse.  Patient again went to the ER on March 10, 2020 and was prescribed Macrobid, Diflucan and Polytrim eyedrop.  Since patient symptoms were getting worse,  patient presents to the ER admits in [facility].  ED Course: In the ER patient is found to have chronic opacities in erythematous conjunctiva.  Has diffuse rash all over the body with deformed target lesions.  There is mucosal ulceration in the tongue and also ulceration of the lips.  Patient has burning sensation on swallowing.  Temperature was around 100 F with labs largely unremarkable except for creatinine of 1.05 potassium 3.4 and sodium 133 and Covid test was negative.  Patient in general picture was concerning for Stevens-Johnson syndrome.  Cause of which was not clear.  Since patient symptoms as per the patient was even present before she took on any of the antibiotics.  Surgery care centers including [facility] and [place] were contacted.  As per the ER physician had discussed with Dr. Rosina Lowenstein at [facility] who is placing patient on the wait list.  ER physician also discussed with Dr. Allena Katz ophthalmologist who at this time requested transfer to tertiary care center pending which advised to give erythromycin eyedrops every 6 hours.  Covid test was negative.  Patient was  admitted with impression of a Stevens-Johnson syndrome versus drug rash.  [facility] transfer line contacted, previous request was canceled since patient was transferred to tertiary care center.  Request placed for transfer to [facility] or [facility] where rheumatology and dermatology is available.  At this point there is no bed available at [facility] or at [facility]  Discussed with dermatologist and rheumatologist at [facility], recommended supportive care, IV fluids,  oral care,  pain control,  IV antibiotics if signs of infection.  Spoke with ophthalmologist Dr. Allena Katz,  he recommended to continue erythromycin every 6 hours, patient has corneal abrasions and he will see the patient on weekend.  Discussed with pharmacist,  reviewed literature,  steroids has conflicting evidence.  So we will hold on steroids.  Infectious disease consulted, awaiting recommendation.  Patient is being managed here until transferred to higher level of care. At this time there is no bed available.    Assessment & Plan:   Principal Problem:   Stevens-Johnson syndrome (HCC)  Drug rash   1. 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  Cause not clear.  As patient states her symptoms started even before she took the antibiotics for possible UTI.  Will give supportive care at this time with IV fluids and as needed IV Benadryl for itching.  Patient symptoms have been present for more than 3 days so not sure if cyclosporine will be effective .   2.  Discussed with dermatologist and rheumatologist at [facility], recommended supportive care, IV fluids,  oral care,  pain control,  IV antibiotics if signs of infection.  As per dermatologist presentation appears more likely drug rash, but could be SJS.  3.  Patient has been accepted by [facility] and [facility] but on waiting list. Awaiting transfer.  4.   Patient was febrile and  was having chills and rigors at admission, was started patient on empiric antibiotics.  Follow  cultures GC probe RPR and HIV.  Sed rate is found to be elevated.  Infectious disease consulted will follow up recommendation.  Given the patient has severe diffuse rash with concerns for Stevens-Johnson syndrome will need inpatient status.   DVT prophylaxis: Heparin. Code Status: Full code. Family Communication: Discussed with patient and husband. Disposition Plan: To be determined. Consults called: None. Admission status: Inpatient.   Consultants:   Dermatology, rheumatology, infectious disease  Procedures:  None.  Antimicrobials: Anti-infectives (From admission, onward)   Start     Dose/Rate Route Frequency Ordered Stop   03/13/20 1000  vancomycin (VANCOREADY) IVPB 1250 mg/250 mL  Status:  Discontinued        1,250 mg 166.7 mL/hr over 90 Minutes Intravenous Every 12 hours 03/13/20 0016 03/13/20 1452   03/13/20 0200  aztreonam (AZACTAM) 2 g in sodium chloride 0.9 % 100 mL IVPB  Status:  Discontinued        2 g 200 mL/hr over 30 Minutes Intravenous Every 8 hours 03/13/20 0016 03/13/20 1452   03/13/20 0100  vancomycin (VANCOREADY) IVPB 1500 mg/300 mL        1,500 mg 150 mL/hr over 120 Minutes Intravenous  Once 03/13/20 0016 03/13/20 0241     Subjective: Patient is seen and examined at bedside.  She reports been in a lot of pain, has rash on the body.  Denies any itching.  Denies any shortness of breath.  Objective: Vitals:   03/12/20 1931 03/12/20 2131 03/13/20 0405 03/13/20 0852  BP: 123/73 109/69 (!) 102/55 104/82  Pulse: 88 94 87 91  Resp: 18 18 16 14   Temp:  100.2 F (37.9 C) 99.9 F (37.7 C) 98.3 F (36.8 C)  TempSrc:  Oral Oral Oral  SpO2: 99% 97% 97% 99%  Weight:      Height:        Intake/Output Summary (Last 24 hours) at 03/13/2020 1613 Last data filed at 03/12/2020 2009 Gross per 24 hour  Intake --  Output 625 ml  Net -625 ml   Filed Weights   03/11/20 2227  Weight: 77.1 kg    Examination:  General exam: Appears calm and  comfortable. Eyes: Conjunctive is injected.  Cornea appears opaque. Heent: Patient has multiple ulcerations on the lips upper and lower.  And thrush appearing lesions on the tongue. Respiratory system: Clear to auscultation. Respiratory effort normal. Cardiovascular system: S1 & S2 heard, RRR. No JVD, murmurs, rubs, gallops or clicks. No pedal edema. Gastrointestinal system: Abdomen is nondistended, soft and nontender. No organomegaly or masses felt. Normal bowel sounds heard. Central nervous system: Alert and oriented. No focal neurological deficits. Extremities: Symmetric 5 x 5 power. Skin: Diffuse papular lesions measuring less than 1 cm all over the body appears deformed targetoid lesions. Psychiatry: Judgement and insight appear normal. Mood & affect appropriate.     Data Reviewed: I have personally reviewed following labs and imaging studies  CBC: Recent Labs  Lab 03/12/20 0244 03/13/20 0338  WBC 8.7 8.3  NEUTROABS 7.1  --   HGB 13.8 12.5  HCT 41.0 37.8  MCV 87.8 88.7  PLT 265 219  Basic Metabolic Panel: Recent Labs  Lab 03/12/20 0244 03/13/20 0338  NA 133* 137  K 3.4* 3.7  CL 98 105  CO2 23 22  GLUCOSE 120* 130*  BUN 17 9  CREATININE 1.05* 1.00  CALCIUM 9.1 8.5*   GFR: Estimated Creatinine Clearance: 89 mL/min (by C-G formula based on SCr of 1 mg/dL). Liver Function Tests: Recent Labs  Lab 03/12/20 0244 03/13/20 0338  AST 40 28  ALT 50* 37  ALKPHOS 71 52  BILITOT 0.5 0.7  PROT 8.0 6.4*  ALBUMIN 3.6 2.7*   No results for input(s): LIPASE, AMYLASE in the last 168 hours. No results for input(s): AMMONIA in the last 168 hours. Coagulation Profile: No results for input(s): INR, PROTIME in the last 168 hours. Cardiac Enzymes: No results for input(s): CKTOTAL, CKMB, CKMBINDEX, TROPONINI in the last 168 hours. BNP (last 3 results) No results for input(s): PROBNP in the last 8760 hours. HbA1C: No results for input(s): HGBA1C in the last 72  hours. CBG: No results for input(s): GLUCAP in the last 168 hours. Lipid Profile: No results for input(s): CHOL, HDL, LDLCALC, TRIG, CHOLHDL, LDLDIRECT in the last 72 hours. Thyroid Function Tests: No results for input(s): TSH, T4TOTAL, FREET4, T3FREE, THYROIDAB in the last 72 hours. Anemia Panel: No results for input(s): VITAMINB12, FOLATE, FERRITIN, TIBC, IRON, RETICCTPCT in the last 72 hours. Sepsis Labs: Recent Labs  Lab 03/12/20 1523  LATICACIDVEN 1.3    Recent Results (from the past 240 hour(s))  Urine Culture     Status: None   Collection Time: 03/10/20 10:04 AM   Specimen: Urine, Clean Catch  Result Value Ref Range Status   Specimen Description URINE, CLEAN CATCH  Final   Special Requests NONE  Final   Culture   Final    NO GROWTH Performed at [facility], [address] [place], [place] [address]    Report Status 03/11/2020 FINAL  Final  Culture, group A strep     Status: None   Collection Time: 03/10/20 10:04 AM   Specimen: Throat  Result Value Ref Range Status   Specimen Description THROAT  Final   Special Requests NONE  Final   Culture   Final    NO GROUP A STREP (S.PYOGENES) ISOLATED Performed at [facility], [address], [place], [place] [address]    Report Status 03/12/2020 FINAL  Final  SARS Coronavirus 2 by RT PCR (hospital order, performed in [facility] hospital lab) Nasopharyngeal Nasopharyngeal Swab     Status: None   Collection Time: 03/12/20  2:44 AM   Specimen: Nasopharyngeal Swab  Result Value Ref Range Status   SARS Coronavirus 2 NEGATIVE NEGATIVE Final    Comment: (NOTE) SARS-CoV-2 target nucleic acids are NOT DETECTED.  The SARS-CoV-2 RNA is generally detectable in upper and lower respiratory specimens during the acute phase of infection. The lowest concentration of SARS-CoV-2 viral copies this assay can detect is 250 copies / mL. A negative result does not preclude SARS-CoV-2 infection and should not be used as the sole  basis for treatment or other patient management decisions.  A negative result may occur with improper specimen collection / handling, submission of specimen other than nasopharyngeal swab, presence of viral mutation(s) within the areas targeted by this assay, and inadequate number of viral copies (<250 copies / mL). A negative result must be combined with clinical observations, patient history, and epidemiological information.  Fact Sheet for Patients:   BoilerBrush.com.cy  Fact Sheet for  Healthcare Providers: https://pope.com/  This test is not yet approved or  cleared by the [place] and has been authorized for detection and/or diagnosis of SARS-CoV-2 by FDA under an Emergency Use Authorization (EUA).  This EUA will remain in effect (meaning this test can be used) for the duration of the COVID-19 declaration under Section 564(b)(1) of the Act, 21 U.S.C. section 360bbb-3(b)(1), unless the authorization is terminated or revoked sooner.  Performed at [facility], [address] [place], [place] [address]   Wound or Superficial Culture     Status: None (Preliminary result)   Collection Time: 03/12/20  1:23 PM   Specimen: Eye  Result Value Ref Range Status   Specimen Description   Final    EYE LEFT Performed at [facility], [address] [place], [place] [address]    Special Requests   Final    Normal Performed at [facility], [address] [place], [place] [address]    Gram Stain NO WBC SEEN NO ORGANISMS SEEN   Final   Culture   Final    NO GROWTH < 24 HOURS Performed at [facility], [address] [place], [place] [address]    Report Status PENDING  Incomplete  Gonococcus culture     Status: None (Preliminary result)   Collection Time: 03/12/20  1:26 PM   Specimen: Eye  Result Value Ref Range Status   Specimen Description   Final    EYE LEFT Performed at [facility], [address] [place], [place] [address]    Special Requests   Final    Normal Performed at [facility], [address] [place], [place] [address]    Culture   Final    CULTURE REINCUBATED FOR BETTER GROWTH Performed at [facility], [address], [place], [place] [address]    Report Status PENDING  Incomplete  Wet prep, genital     Status: Abnormal   Collection Time: 03/12/20  2:50 PM   Specimen: PATH Cytology Cervicovaginal Ancillary Only  Result Value Ref Range Status   Yeast Wet Prep HPF POC NONE SEEN NONE SEEN Final   Trich, Wet Prep NONE SEEN NONE SEEN Final   Clue Cells Wet Prep HPF POC PRESENT (A) NONE SEEN Final   WBC, Wet Prep HPF POC MANY (A) NONE SEEN Final   Sperm NONE SEEN  Final    Comment: Performed at [facility], [address] [place], [place] [address]  Culture, blood (routine x 2)     Status: None (Preliminary result)   Collection Time: 03/12/20  3:23 PM   Specimen: BLOOD  Result Value Ref Range Status   Specimen Description   Final    BLOOD LEFT ANTECUBITAL Performed at [facility], [address], [place], [place] [address]    Special Requests   Final    BOTTLES DRAWN AEROBIC AND ANAEROBIC Blood Culture adequate volume Performed at [facility], [address] [place], [place] [address]    Culture   Final    NO GROWTH < 12 HOURS Performed at [facility], [address], [place], [place] [address]    Report Status PENDING  Incomplete  Culture, blood (routine x 2)     Status: None (Preliminary result)   Collection Time: 03/12/20  3:25 PM   Specimen: BLOOD  Result  Value Ref Range Status   Specimen Description   Final    BLOOD RIGHT ANTECUBITAL Performed at [facility], [address] [place], [place] [address]    Special Requests   Final    BOTTLES DRAWN AEROBIC AND ANAEROBIC Blood Culture adequate volume Performed at [facility], [address] [place], [place] [address]    Culture   Final    NO GROWTH < 12 HOURS Performed at [facility], [address] [place], [place] [address]    Report Status PENDING  Incomplete     Radiology Studies: DG Chest Port 1 View  Result Date: 03/12/2020 CLINICAL DATA:  Generalized fatigue with nausea and vomiting for 3 days. EXAM: PORTABLE CHEST 1 VIEW COMPARISON:  None. FINDINGS: The cardiac silhouette, mediastinal and hilar contours are normal. Low lung volumes with streaky bibasilar atelectasis. No infiltrates or effusions. No pulmonary lesions. The bony thorax is intact. IMPRESSION: Low lung volumes with streaky bibasilar atelectasis. No definite infiltrates or effusions. Electronically Signed   By: Rudie MeyerP.  Gallerani M.D.   On: 03/12/2020 06:22    Scheduled Meds: . erythromycin   Both Eyes Q6H  . heparin  5,000 Units Subcutaneous Q8H   Continuous Infusions: . dextrose 5 % and 0.45% NaCl 125 mL/hr at 03/13/20 0007     LOS: 1 day    Time spent: 35 mins.    Cipriano BunkerPARDEEP Kingdavid Leinbach, MD [facility]   If 7PM-7AM, please contact night-coverage

## 2020-03-13 NOTE — Progress Notes (Signed)
At bedside to explain PICC procedure, risks and benefits to the patient.  Dicussed that MD has ordered the PICC and why.  Once explanation completed and patient asked if she had any questions she stated "I don't want that".  Explained to patient that she had the access that was needed for now and that we would have to restart IV if it came out and could use [place] to do this, but PICC would need to be readdressed in the morning with the MD.  Patient nodded head that she understood this.  Husband at bedside and without questions.  Sherrilyn Rist, RN updated.  IV team will follow up in am.

## 2020-03-13 NOTE — Progress Notes (Signed)
Patient vomiting , declines VS. Wants room dark and quiet. Sign placed on door to see nurse before entering.

## 2020-03-13 NOTE — Consult Note (Signed)
[facility]    Date of Admission:  03/12/2020      Total days of antibiotics2        Reason for Consult: stevens johnson syndrome    Referring Provider:  Lucianne Muss   Assessment: SJS/TEN syndrome, unclear trigger. Has had multiple visits to the ED since last week since onset of symptoms with worsening eye pain,conjunctivitis, mouth pain due to mucosal desquamation and painful papular lesions on extremities, torso, and palms/soles  Plan: 1. Recommend to change eye drops to include steroids drops as well as antibacterial (will switch erythro to moxi) 2. Will do short course of IV steroids to see if any improvement with eye involvement. 3. With corneal abrasions noted on ED exam - defer to ophtho for further management 4. Continue with pain management and ivf hydration 5. Fevers- appears isolated, awaiting culture results. She was started on vancomycin and aztreonam empirically. Her cultures are pending. She is clinically stable. Would recommend to wait to see what we need to treat rather than giving more abtx that could complicate the picture.  Dr Luciana Axe to see tomorrow  Principal Problem:   Stevens-Johnson syndrome (HCC)   . erythromycin   Both Eyes Q6H  . heparin  5,000 Units Subcutaneous Q8H    HPI: Rachel Compton is a 29 y.o. female admitted from the ER from home after she noticed worsening rash and oral lesions yesterday.   No significant past medical history. Recently traveled to [place] and noticed discharge of both eyes on August 5th that was initially clear and painful. She tried a few OTC eye drops that did not help. Sought care in locally and    Review of Systems: ROS  Past Medical History:  Diagnosis Date  . Asthma   . Chicken pox   . Migraine    04/22/08 MRI normal     Social History   Tobacco Use  . Smoking status: Never Smoker  . Smokeless tobacco: Never Used  Vaping Use  . Vaping Use: Former  Substance  Use Topics  . Alcohol use: Yes    Alcohol/week: 5.0 standard drinks    Types: 5 Standard drinks or equivalent per week  . Drug use: No    Family History  Adopted: Yes  Problem Relation Age of Onset  . Cancer Neg Hx   . Diabetes Neg Hx   . Hyperlipidemia Neg Hx   . Hypertension Neg Hx   . Thyroid disease Neg Hx    Allergies  Allergen Reactions  . Penicillins Hives    OBJECTIVE: Blood pressure 104/82, pulse 91, temperature 98.3 F (36.8 C), temperature source Oral, resp. rate 14, height 5\' 8"  (1.727 m), weight 77.1 kg, last menstrual period 03/11/2020, SpO2 99 %.  Physical Exam  Lab Results Lab Results  Component Value Date   WBC 8.3 03/13/2020   HGB 12.5 03/13/2020   HCT 37.8 03/13/2020   MCV 88.7 03/13/2020   PLT 219 03/13/2020    Lab Results  Component Value Date   CREATININE 1.00 03/13/2020   BUN 9 03/13/2020   NA 137 03/13/2020   K 3.7 03/13/2020   CL 105 03/13/2020   CO2 22 03/13/2020    Lab Results  Component Value Date   ALT 37 03/13/2020   AST 28 03/13/2020   ALKPHOS 52 03/13/2020   BILITOT 0.7 03/13/2020     Microbiology: Recent Results (from the past 240 hour(s))  Urine Culture  Status: None   Collection Time: 03/10/20 10:04 AM   Specimen: Urine, Clean Catch  Result Value Ref Range Status   Specimen Description URINE, CLEAN CATCH  Final   Special Requests NONE  Final   Culture   Final    NO GROWTH Performed at [facility], [address] [place], [place] [address]    Report Status 03/11/2020 FINAL  Final  Culture, group A strep     Status: None   Collection Time: 03/10/20 10:04 AM   Specimen: Throat  Result Value Ref Range Status   Specimen Description THROAT  Final   Special Requests NONE  Final   Culture   Final    NO GROUP A STREP (S.PYOGENES) ISOLATED Performed at [facility], [address], [place], [place] [address]    Report Status 03/12/2020 FINAL  Final  SARS Coronavirus 2 by RT PCR (hospital order,  performed in [facility] hospital lab) Nasopharyngeal Nasopharyngeal Swab     Status: None   Collection Time: 03/12/20  2:44 AM   Specimen: Nasopharyngeal Swab  Result Value Ref Range Status   SARS Coronavirus 2 NEGATIVE NEGATIVE Final    Comment: (NOTE) SARS-CoV-2 target nucleic acids are NOT DETECTED.  The SARS-CoV-2 RNA is generally detectable in upper and lower respiratory specimens during the acute phase of infection. The lowest concentration of SARS-CoV-2 viral copies this assay can detect is 250 copies / mL. A negative result does not preclude SARS-CoV-2 infection and should not be used as the sole basis for treatment or other patient management decisions.  A negative result may occur with improper specimen collection / handling, submission of specimen other than nasopharyngeal swab, presence of viral mutation(s) within the areas targeted by this assay, and inadequate number of viral copies (<250 copies / mL). A negative result must be combined with clinical observations, patient history, and epidemiological information.  Fact Sheet for Patients:   BoilerBrush.com.cy  Fact Sheet for Healthcare Providers: https://pope.com/  This test is not yet approved or  cleared by the [place] FDA and has been authorized for detection and/or diagnosis of SARS-CoV-2 by FDA under an Emergency Use Authorization (EUA).  This EUA will remain in effect (meaning this test can be used) for the duration of the COVID-19 declaration under Section 564(b)(1) of the Act, 21 U.S.C. section 360bbb-3(b)(1), unless the authorization is terminated or revoked sooner.  Performed at [facility], [address] [place], [place] [address]   Wound or Superficial Culture     Status: None (Preliminary result)   Collection Time: 03/12/20  1:23 PM   Specimen: Eye  Result Value Ref Range Status   Specimen Description   Final    EYE LEFT Performed  at [facility], [address] [place], [place] [address]    Special Requests   Final    Normal Performed at [facility], [address] [place], [place] [address]    Gram Stain NO WBC SEEN NO ORGANISMS SEEN   Final   Culture   Final    NO GROWTH < 24 HOURS Performed at [facility], [address], [place], [place] [address]    Report Status PENDING  Incomplete  Gonococcus culture     Status: None (Preliminary result)   Collection Time: 03/12/20  1:26 PM   Specimen: Eye  Result Value Ref Range Status   Specimen Description   Final  EYE LEFT Performed at [facility], [address] [place], [place] [address]    Special Requests   Final    Normal Performed at [facility], [address] [place], [place] [address]    Culture   Final    CULTURE REINCUBATED FOR BETTER GROWTH Performed at [facility], [address], [place], [place] [address]    Report Status PENDING  Incomplete  Wet prep, genital     Status: Abnormal   Collection Time: 03/12/20  2:50 PM   Specimen: PATH Cytology Cervicovaginal Ancillary Only  Result Value Ref Range Status   Yeast Wet Prep HPF POC NONE SEEN NONE SEEN Final   Trich, Wet Prep NONE SEEN NONE SEEN Final   Clue Cells Wet Prep HPF POC PRESENT (A) NONE SEEN Final   WBC, Wet Prep HPF POC MANY (A) NONE SEEN Final   Sperm NONE SEEN  Final    Comment: Performed at [facility], [address] [place], [place] [address]  Culture, blood (routine x 2)     Status: None (Preliminary result)   Collection Time: 03/12/20  3:23 PM   Specimen: BLOOD  Result Value Ref Range Status   Specimen Description   Final    BLOOD LEFT ANTECUBITAL Performed at [facility], [address] [place], [place] [address]    Special Requests   Final    BOTTLES DRAWN AEROBIC AND ANAEROBIC Blood Culture adequate volume Performed at [facility], [address] [place], [place]  [address]    Culture   Final    NO GROWTH < 12 HOURS Performed at [facility], [address] [place], [place] [address]    Report Status PENDING  Incomplete  Culture, blood (routine x 2)     Status: None (Preliminary result)   Collection Time: 03/12/20  3:25 PM   Specimen: BLOOD  Result Value Ref Range Status   Specimen Description   Final    BLOOD RIGHT ANTECUBITAL Performed at [facility], [address] [place], [place] [address]    Special Requests   Final    BOTTLES DRAWN AEROBIC AND ANAEROBIC Blood Culture adequate volume Performed at [facility], [address] [place], [place] [address]    Culture   Final    NO GROWTH < 12 HOURS Performed at [facility], [address] [place], [place] [address]    Report Status PENDING  Incomplete    Rexene Alberts, MSN, NP-C [facility]  [email or web address] Pager: [phone] Office: [phone] [facility] Main Line: [phone]

## 2020-03-13 NOTE — Progress Notes (Addendum)
Patient to room 4E03 from 5N. Vital signs obtained. On monitor CCMD notified. Eyes swollen shut, when eye lids open eyeballs bright red. Lips dry and peeling. Blisters all over body. Alert and oriented to room and call light. Patient with vomiting x2 on admission to 4 east.  Call bell within reach.  Sabra Heck, RN

## 2020-03-13 NOTE — Progress Notes (Signed)
At bedside to assess if patient is candidate for bedside PICC.  Patient currently vomiting and unable to have placement done due to inability to maintain sterile field at this time.  Patient also has broken skin bilaterally in upper arms.  PICC would only be able to be placed if able to find vein in area that has unbroken skin due to the increased risk for infection.  Once vomiting is controlled, VAST will return and assess for PICC.  Sherrie, RN at bedside and above discussed.  Sherrie states that MD is due to bedside and will address this with him.  Patient currently has adequate IV access.  Will follow up.

## 2020-03-13 NOTE — Progress Notes (Signed)
Pharmacy Antibiotic Note  Rachel Compton is a 29 y.o. 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  Pharmacy has been consulted for Vancomycin and Aztreonam dosing.  Plan: Vancomycin 1500 mg IV now, then 1250 mg IV q12h Aztreonam 2 g IV q8h  Height: 5\' 8"  (172.7 cm) Weight: 77.1 kg (170 lb) IBW/kg (Calculated) : 63.9  Temp (24hrs), Avg:99.2 F (37.3 C), Min:98.4 F (36.9 C), Max:100.2 F (37.9 C)  Recent Labs  Lab 03/12/20 0244 03/12/20 1523  WBC 8.7  --   CREATININE 1.05*  --   LATICACIDVEN  --  1.3    Estimated Creatinine Clearance: 84.8 mL/min (A) (by C-G formula based on SCr of 1.05 mg/dL (H)).    Allergies  Allergen Reactions   Penicillins Hives    05/12/20 03/13/2020 12:05 AM

## 2020-03-13 NOTE — Care Plan (Signed)
Discussed the case with hospitalist at [facility] about transfer, at this time patient is on wait list, [facility] burn center, they replied Patient does not require burns level care, they have been contacted by ED earlier in the week, they declined the transfer.  Patient is on waiting list for [facility]. Infectious disease consulted, recommended trial of steroids, PICC line for poor IV access, pain control, IV fluids, hold off antibiotics for now. Discussed the case with rheumatologist and dermatologist @ [facility],  recommended supportive care,  pain control, IV fluids, oral care.  Discussed the case with Dr. Allena Katz ophthalmologist. He will see the patient tomorrow morning.  Will sign out to the night team.  Patient and the family member in the room was notified about above.  [facility] on-call was made aware.

## 2020-03-14 DIAGNOSIS — L511 Stevens-Johnson syndrome: Principal | ICD-10-CM

## 2020-03-14 LAB — CBC
HCT: 38.7 % (ref 36.0–46.0)
Hemoglobin: 12.7 g/dL (ref 12.0–15.0)
MCH: 29 pg (ref 26.0–34.0)
MCHC: 32.8 g/dL (ref 30.0–36.0)
MCV: 88.4 fL (ref 80.0–100.0)
Platelets: 245 10*3/uL (ref 150–400)
RBC: 4.38 MIL/uL (ref 3.87–5.11)
RDW: 12.3 % (ref 11.5–15.5)
WBC: 6.1 10*3/uL (ref 4.0–10.5)
nRBC: 0 % (ref 0.0–0.2)

## 2020-03-14 LAB — GONOCOCCUS CULTURE
Culture: NO GROWTH
Special Requests: NORMAL

## 2020-03-14 LAB — COMPREHENSIVE METABOLIC PANEL
ALT: 32 U/L (ref 0–44)
AST: 24 U/L (ref 15–41)
Albumin: 2.5 g/dL — ABNORMAL LOW (ref 3.5–5.0)
Alkaline Phosphatase: 53 U/L (ref 38–126)
Anion gap: 9 (ref 5–15)
BUN: 16 mg/dL (ref 6–20)
CO2: 24 mmol/L (ref 22–32)
Calcium: 8.7 mg/dL — ABNORMAL LOW (ref 8.9–10.3)
Chloride: 105 mmol/L (ref 98–111)
Creatinine, Ser: 0.86 mg/dL (ref 0.44–1.00)
GFR calc Af Amer: >60 mL/min (ref >60)
GFR calc non Af Amer: >60 mL/min (ref >60)
Glucose, Bld: 167 mg/dL — ABNORMAL HIGH (ref 70–99)
Potassium: 4 mmol/L (ref 3.5–5.1)
Sodium: 138 mmol/L (ref 135–145)
Total Bilirubin: 0.3 mg/dL (ref 0.3–1.2)
Total Protein: 6.5 g/dL (ref 6.5–8.1)

## 2020-03-14 LAB — PHOSPHORUS: Phosphorus: 4.9 mg/dL — ABNORMAL HIGH (ref 2.5–4.6)

## 2020-03-14 LAB — MAGNESIUM: Magnesium: 2.3 mg/dL (ref 1.7–2.4)

## 2020-03-14 LAB — AEROBIC CULTURE W GRAM STAIN (SUPERFICIAL SPECIMEN)
Culture: NORMAL
Gram Stain: NONE SEEN
Special Requests: NORMAL

## 2020-03-14 LAB — CRYPTOCOCCAL ANTIGEN: Crypto Ag: NEGATIVE

## 2020-03-14 MED ORDER — ERYTHROMYCIN 5 MG/GM OP OINT
TOPICAL_OINTMENT | Freq: Four times a day (QID) | OPHTHALMIC | Status: DC
  Administered 2020-03-14: 1 via OPHTHALMIC
  Filled 2020-03-14: qty 3.5

## 2020-03-14 MED ORDER — MAGIC MOUTHWASH
5.0000 mL | Freq: Three times a day (TID) | ORAL | Status: DC | PRN
  Administered 2020-03-14: 5 mL via ORAL
  Filled 2020-03-14 (×3): qty 5

## 2020-03-14 NOTE — Progress Notes (Signed)
Secure chat with Dr Lucianne Muss, orders ok to d/c PICC order

## 2020-03-14 NOTE — Consult Note (Signed)
Merry Proud Delbert Phenix                                                                               03/14/2020                                               Pediatric Ophthalmology Consultation                                         Consult requested by: Dr. Lucianne Muss   Reason for consultation:  Blurry vision, Para Skeans Syndrome  HPI:  One week history of blurry vision and eye pain.  Patient notes eye redness and pain over one week.  States she was seen at [facility] and presented with hematemesis.  She recently traveled to [place] and developed discharge from her eyes on 03/05/20.  She took OTC medicine and later developed rash on the periphery of her upper extremities and dysuria.  She received an injection of doxycycline on 03/07/20.  The rash and dysuria worsened.  She then presented to ED on 03/10/20 and given Macrobid Diflucan and Polytrim eye drops.  In the ED, she was noted to have hyperemic conjunctiva, tongue and lip ulcerations, burning while swallowing.  She is currently on a wait list for transfer to [facility].  IV steroids have decreased her fever.    Pertinent Medical History:   Active Ambulatory Problems    Diagnosis Date Noted   Annual physical exam 06/09/2016   Acute nonintractable headache 02/23/2017   Bloating 02/23/2017   Boil 10/04/2017   Right ankle pain 11/15/2017   Allergic rhinitis 11/15/2017   Other fatigue 02/09/2018   Enlarged thyroid 02/09/2018   Lymphadenopathy 04/16/2018   Constipation 02/22/2019   Acute otalgia, right 08/27/2019   Cerumen impaction 08/27/2019   Breast lump in upper outer quadrant 09/27/2019   Resolved Ambulatory Problems    Diagnosis Date Noted   Chest pain 06/09/2016   Acute sinusitis 12/28/2016   Past Medical History:  Diagnosis Date   Asthma    Chicken pox    Migraine       Pertinent Ophthalmic History: None - contact lens wearer - rarely wears contact lenses and was not wearing at the onset  of these symptoms  Current Eye Medications: Zymar  Systemic medications on admission:   Medications Prior to Admission  Medication Sig Dispense Refill   Drospirenone-Ethinyl Estradiol-Levomefol (BEYAZ) 3-0.02-0.451 MG tablet Take 1 tablet by mouth daily.     fluconazole (DIFLUCAN) 150 MG tablet Take 1 tablet (150 mg total) by mouth every other day. May repeat in 72 hours if needed (Patient not taking: Reported on 03/13/2020) 3 tablet 0   lidocaine (XYLOCAINE) 2 % solution Use as directed 15 mLs in the mouth or throat as needed for mouth pain. (Patient not taking: Reported on 03/13/2020) 100 mL 0   montelukast (SINGULAIR) 10 MG tablet Take 1 tablet (10 mg total) by mouth at bedtime. (  Patient not taking: Reported on 03/13/2020) 90 tablet 1   nitrofurantoin, macrocrystal-monohydrate, (MACROBID) 100 MG capsule Take 1 capsule (100 mg total) by mouth 2 (two) times daily. (Patient not taking: Reported on 03/13/2020) 10 capsule 0   SUMAtriptan (IMITREX) 50 MG tablet TAKE 1 TABLET BY MOUTH EVERY 2 HOURS AS NEEDED FOR MIGRAINE. MAY REPEAT IN 2 HOURS IF HEADACHE PERSISTS. MAXIMUM DAILY DOSE IS 4 TABLETS (Patient not taking: Reported on 03/13/2020) 10 tablet 0   trimethoprim-polymyxin b (POLYTRIM) ophthalmic solution Place 1 drop into the left eye every 4 (four) hours. (Patient not taking: Reported on 03/13/2020) 10 mL 0       ROS:  Eyes: Bilateral eye pain and difficulty opening eyes ENMT: Dry and ulcerated lips Neck: negative  Respiratory: negative  Cardiovascular: negative  Abdomen: negative  Musculoskeletal: negative  Skin: Diffuse papular lesions Neurologic: negative  Psychiatric: negative  A& O x 3  Visual Fields: difficult to attain due to poor ability to open eyes   Pupils:  no APD     Near acuity:   [place]   CF @ 14ft    [place]   CF @ 80ft   TA:         Normal to palpation OU      Dilation:  No view due to corneal epithelial defect  External:   OD:  Eyelid edema   OS:  Eyelid  edema  Anterior segment exam:  By penlight     Conjunctiva:  OD:  3+ hyperemia   OS:   3+ hyperemia  Cornea:    OD: inferior corneal epithelial defect (50% cornea)   OS: inferior corneal epithelial defect  (30% cornea)  Anterior Chamber: - hazy view from epithelial defect   OD:  Deep/quiet     OS:  Deep/quiet    Iris:  hazy view from epithelial defect   OD:  Normal     OS:  Normal    Lens:  hazy view from epithelial defect   OD:  Clear      OS:  Clear          Optic disc: No view due to corneal epithelial defect and difficulty opening eyes   Central retina--examined with indirect ophthalmoscope: No view due to corneal epithelial defect and difficulty opening eyes  Peripheral retina--examined with indirect ophthalmoscope with lid speculum and scleral depression:  No view due to corneal epithelial defect and difficulty opening eyes  Impression:   1) Severe conjunctival hyperemia with membranes OD>OS 2) 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 Lucianne Muss . Please contact our office with any questions or concerns at [phone].   Carmela Rima, MD

## 2020-03-14 NOTE — Progress Notes (Signed)
Spoke with Fayrene Fearing RN re PICC order.  Pt has adequate access at this time.  Per chart, pt refused PICC placement yesterday pm.  Fayrene Fearing to notify MD to see if PICC order can be cancelled.

## 2020-03-14 NOTE — Progress Notes (Addendum)
PROGRESS NOTE    Rachel Compton  OHY:[id number] DOB: 10-Jun-1991 DOA: 03/12/2020 PCP: [name], FNP   Brief Narrative:  Rachel Compton is a 29 y.o. female with no significant past medical history who has recently traveled to [place] with the family started developing eye discharge on August 5 for which patient took over-the-counter medications.  Patient started having rash in the periphery of the upper extremities and some dysuria for which patient went to the urgent care and as per the patient , she was provided an injection and oral doxycycline. This was on March 07, 2020.  Despite which patient's rash got worse and dysuria also got worse.  Patient again went to the ER on March 10, 2020 and was prescribed Macrobid, Diflucan and Polytrim eyedrop.  Since patient symptoms were getting worse,  patient presents to the ER admits in [facility].  ED Course: In the  [facility] patient was found to have chronic opacities in erythematous conjunctiva.  Has diffuse rash all over the body with deformed target lesions.  There is mucosal ulceration in the tongue and also ulceration of the lips.  Patient has burning sensation on swallowing. Temperature was around 100 F with labs largely unremarkable except for creatinine of 1.05 potassium 3.4 and sodium 133 and Covid test was negative. Patient in general picture was concerning for Stevens-Johnson syndrome.  Cause of which was not clear.  Since patient symptoms as per the patient was even present before she took on any of the antibiotics. Tertiary care centers including [place] and [place] were contacted.  As per the ER physician had discussed with Dr. Rosina Lowenstein at [facility] who is placing patient on the wait list.  ER physician also discussed with Dr. Allena Katz ophthalmologist who at this time requested transfer to tertiary care center pending which advised to give erythromycin eyedrops every 6 hours.  Covid test was  negative.  Patient was admitted with impression of a Stevens-Johnson syndrome versus drug rash.  [facility] transfer line contacted, previous request was canceled since patient was transferred to tertiary care center.  Request placed for transfer to [facility] or [facility] where rheumatology and dermatology is available.  At this point there is no bed available at [facility] or at [facility].  Discussed with dermatologist and rheumatologist at [facility], who recommended supportive care, IV fluids,  oral care,  pain control,  IV antibiotics if signs of infection.  Spoke with ophthalmologist Dr. Allena Katz,  he recommended to continue erythromycin every 6 hours, patient has corneal abrasions and he will see the patient on weekend.  Discussed with pharmacist,  reviewed literature,  steroids has conflicting evidence.  Infectious disease consulted, recommended trial of IV steroids,  Patient is being managed here until transferred to higher level of care. At this time there is no bed available.    Assessment & Plan:   Principal Problem:   Stevens-Johnson syndrome (HCC)  Drug rash   1. 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  Cause not clear.  As patient states her symptoms started even before she took the antibiotics for possible UTI.  Will give supportive care at this time with IV fluids and as needed IV Benadryl for itching.  Patient symptoms have been present for more than 3 days so not sure if cyclosporine will be effective .   2.  Discussed with dermatologist and rheumatologist at [facility], recommended supportive care, IV fluids,  oral care,  pain control,  IV antibiotics if  signs of infection.  As per dermatologist presentation appears more likely drug rash, but could be SJS.  3.   Patient has been accepted by [facility] and [facility] but on waiting list. Awaiting transfer.  [facility] has no in-house dermatologist or rheumatologist.  4.   Patient was febrile and  was having chills and rigors at  admission, was started patient on empiric antibiotics. GC: Neg , RPR and HIV Neg.  Sed rate is found to be elevated.  Infectious disease consulted, recommended trial of IV steroids.  No antibiotic at this time.  5.  Ophthalmologist recommended erythromycin every 6 hours, will follow up today.  Given the patient has severe diffuse rash with concerns for Stevens-Johnson syndrome will need inpatient status.   DVT prophylaxis: Heparin. Code Status: Full code. Family Communication: Discussed with patient and husband. Disposition Plan: To be determined. Consults called: None. Admission status: Inpatient.   Consultants:   Dermatology, rheumatology, infectious disease  Procedures:  None.  Antimicrobials: Anti-infectives (From admission, onward)   Start     Dose/Rate Route Frequency Ordered Stop   03/13/20 1000  vancomycin (VANCOREADY) IVPB 1250 mg/250 mL  Status:  Discontinued        1,250 mg 166.7 mL/hr over 90 Minutes Intravenous Every 12 hours 03/13/20 0016 03/13/20 1452   03/13/20 0200  aztreonam (AZACTAM) 2 g in sodium chloride 0.9 % 100 mL IVPB  Status:  Discontinued        2 g 200 mL/hr over 30 Minutes Intravenous Every 8 hours 03/13/20 0016 03/13/20 1452   03/13/20 0100  vancomycin (VANCOREADY) IVPB 1500 mg/300 mL        1,500 mg 150 mL/hr over 120 Minutes Intravenous  Once 03/13/20 0016 03/13/20 0241     Subjective: Patient is seen and examined at bedside.  No overnight events.  She reports been in a lot of pain, has rash on the body.  Denies any itching.  Denies any shortness of breath.  Vitals stable.  Husband at bedside.  Explained in detail about unavailability of bed @ [facility] and [facility].  Objective: Vitals:   03/14/20 0220 03/14/20 0426 03/14/20 1033 03/14/20 1228  BP: 101/76 105/76 103/76 101/77  Pulse: 66 71 67 63  Resp: Temp: 97.9 F (36.6 C) 98.6 F (37 C) 99.2 F (37.3 C) 99.1 F (37.3 C)  TempSrc: Axillary Oral Oral Oral  SpO2: 100%  100% 100% 100%  Weight:      Height:        Intake/Output Summary (Last 24 hours) at 03/14/2020 1412 Last data filed at 03/14/2020 1037 Gross per 24 hour  Intake 2381.25 ml  Output 200 ml  Net 2181.25 ml   Filed Weights   03/11/20 2227  Weight: 77.1 kg    Examination:  General exam: Appears calm and comfortable. Eyes: Conjunctive is injected.  Cornea appears opaque. Heent: Patient has multiple ulcerations on the lips upper and lower.  And thrush appearing lesions on the tongue. Respiratory system: Clear to auscultation. Respiratory effort normal. Cardiovascular system: S1 & S2 heard, RRR. No JVD, murmurs, rubs, gallops or clicks. No pedal edema. Gastrointestinal system: Abdomen is nondistended, soft and nontender. No organomegaly or masses felt. Normal bowel sounds heard. Central nervous system: Alert and oriented. No focal neurological deficits. Extremities: Symmetric 5 x 5 power. Skin: Diffuse papular lesions measuring less than 1 cm all over the body appears deformed targetoid lesions. Psychiatry: Judgement and insight appear normal. Mood & affect  appropriate.     Data Reviewed: I have personally reviewed following labs and imaging studies  CBC: Recent Labs  Lab 03/12/20 0244 03/13/20 0338 03/14/20 0323  WBC 8.7 8.3 6.1  NEUTROABS 7.1  --   --   HGB 13.8 12.5 12.7  HCT 41.0 37.8 38.7  MCV 87.8 88.7 88.4  PLT 265 219 245   Basic Metabolic Panel: Recent Labs  Lab 03/12/20 0244 03/13/20 0338 03/14/20 0323  NA 133* 137 138  K 3.4* 3.7 4.0  CL 98 105 105  CO2 23 22 24   GLUCOSE 120* 130* 167*  BUN 17 9 16   CREATININE 1.05* 1.00 0.86  CALCIUM 9.1 8.5* 8.7*  MG  --   --  2.3  PHOS  --   --  4.9*   GFR: Estimated Creatinine Clearance: 103.5 mL/min (by C-G formula based on SCr of 0.86 mg/dL). Liver Function Tests: Recent Labs  Lab 03/12/20 0244 03/13/20 0338 03/14/20 0323  AST 40 28 24  ALT 50* 37 32  ALKPHOS 71 52 53  BILITOT 0.5 0.7 0.3  PROT 8.0  6.4* 6.5  ALBUMIN 3.6 2.7* 2.5*   No results for input(s): LIPASE, AMYLASE in the last 168 hours. No results for input(s): AMMONIA in the last 168 hours. Coagulation Profile: No results for input(s): INR, PROTIME in the last 168 hours. Cardiac Enzymes: No results for input(s): CKTOTAL, CKMB, CKMBINDEX, TROPONINI in the last 168 hours. BNP (last 3 results) No results for input(s): PROBNP in the last 8760 hours. HbA1C: No results for input(s): HGBA1C in the last 72 hours. CBG: No results for input(s): GLUCAP in the last 168 hours. Lipid Profile: No results for input(s): CHOL, HDL, LDLCALC, TRIG, CHOLHDL, LDLDIRECT in the last 72 hours. Thyroid Function Tests: No results for input(s): TSH, T4TOTAL, FREET4, T3FREE, THYROIDAB in the last 72 hours. Anemia Panel: No results for input(s): VITAMINB12, FOLATE, FERRITIN, TIBC, IRON, RETICCTPCT in the last 72 hours. Sepsis Labs: Recent Labs  Lab 03/12/20 1523  LATICACIDVEN 1.3    Recent Results (from the past 240 hour(s))  Urine Culture     Status: None   Collection Time: 03/10/20 10:04 AM   Specimen: Urine, Clean Catch  Result Value Ref Range Status   Specimen Description URINE, CLEAN CATCH  Final   Special Requests NONE  Final   Culture   Final    NO GROWTH Performed at [facility], [address] [place], [address] [place]    Report Status 03/11/2020 FINAL  Final  Culture, group A strep     Status: None   Collection Time: 03/10/20 10:04 AM   Specimen: Throat  Result Value Ref Range Status   Specimen Description THROAT  Final   Special Requests NONE  Final   Culture   Final    NO GROUP A STREP (S.PYOGENES) ISOLATED Performed at [facility], [address] [place], [address] [place]    Report Status 03/12/2020 FINAL  Final  SARS Coronavirus 2 by RT PCR (hospital order, performed in [facility] hospital lab) Nasopharyngeal Nasopharyngeal Swab     Status: None   Collection Time: 03/12/20  2:44 AM   Specimen:  Nasopharyngeal Swab  Result Value Ref Range Status   SARS Coronavirus 2 NEGATIVE NEGATIVE Final    Comment: (NOTE) SARS-CoV-2 target nucleic acids are NOT DETECTED.  The SARS-CoV-2 RNA is generally detectable in upper and lower respiratory specimens during the acute phase of infection. The lowest concentration of SARS-CoV-2 viral  copies this assay can detect is 250 copies / mL. A negative result does not preclude SARS-CoV-2 infection and should not be used as the sole basis for treatment or other patient management decisions.  A negative result may occur with improper specimen collection / handling, submission of specimen other than nasopharyngeal swab, presence of viral mutation(s) within the areas targeted by this assay, and inadequate number of viral copies (<250 copies / mL). A negative result must be combined with clinical observations, patient history, and epidemiological information.  Fact Sheet for Patients:   BoilerBrush.com.cyhttps://www.fda.gov/media/136312/download  Fact Sheet for Healthcare Providers: https://pope.com/https://www.fda.gov/media/136313/download  This test is not yet approved or  cleared by the [place] FDA and has been authorized for detection and/or diagnosis of SARS-CoV-2 by FDA under an Emergency Use Authorization (EUA).  This EUA will remain in effect (meaning this test can be used) for the duration of the COVID-19 declaration under Section 564(b)(1) of the Act, 21 U.S.C. section 360bbb-3(b)(1), unless the authorization is terminated or revoked sooner.  Performed at [facility] [place], [address] [place], [place] [address]   Wound or Superficial Culture     Status: None   Collection Time: 03/12/20  1:23 PM   Specimen: Eye  Result Value Ref Range Status   Specimen Description   Final    EYE LEFT Performed at [facility] [place], [address] [place], [place] [address]    Special Requests   Final    Normal Performed at [facility] [place], [address] [place], [place] [address]    Gram Stain NO WBC SEEN NO ORGANISMS SEEN   Final   Culture   Final    NORMAL SKIN FLORA Performed at [facility], [address] [place], [place] [address]    Report Status 03/14/2020 FINAL  Final  Gonococcus culture     Status: None   Collection Time: 03/12/20  1:26 PM   Specimen: Eye  Result Value Ref Range Status   Specimen Description   Final    EYE LEFT Performed at [facility] [place], [address] [place], [place] [address]    Special Requests   Final    Normal Performed at [facility] [place], [address] [place], [place] [address]    Culture   Final    NO GROWTH 2 DAYS Performed at [facility], [address] [place], [place] [address]    Report Status 03/14/2020 FINAL  Final  Wet prep, genital     Status: Abnormal   Collection Time: 03/12/20  2:50 PM   Specimen: PATH Cytology Cervicovaginal Ancillary Only  Result Value Ref Range Status   Yeast Wet Prep HPF POC NONE SEEN NONE SEEN Final   Trich, Wet Prep NONE SEEN NONE SEEN Final   Clue Cells Wet Prep HPF POC PRESENT (A) NONE SEEN Final   WBC, Wet Prep HPF POC MANY (A) NONE SEEN Final   Sperm NONE SEEN  Final    Comment: Performed at [facility] [place], [address] [place], [place] [address]  Culture, blood (routine x 2)     Status: None (Preliminary result)   Collection Time: 03/12/20  3:23 PM   Specimen: BLOOD  Result Value Ref Range Status   Specimen Description   Final    BLOOD LEFT ANTECUBITAL Performed at [facility], [address] [place], [place] [address]  Special Requests   Final    BOTTLES DRAWN AEROBIC AND ANAEROBIC Blood Culture adequate volume Performed at [facility], [address] [place], [place] [address]    Culture   Final    NO GROWTH 2 DAYS Performed at [facility], [address] [place], [place] [address]    Report Status PENDING  Incomplete  Culture, blood (routine x 2)      Status: None (Preliminary result)   Collection Time: 03/12/20  3:25 PM   Specimen: BLOOD  Result Value Ref Range Status   Specimen Description   Final    BLOOD RIGHT ANTECUBITAL Performed at [facility], [address], [place], [place] [address]    Special Requests   Final    BOTTLES DRAWN AEROBIC AND ANAEROBIC Blood Culture adequate volume Performed at [facility], [address] [place], [place] [address]    Culture   Final    NO GROWTH 2 DAYS Performed at [facility], [address] [place], [place] [address]    Report Status PENDING  Incomplete     Radiology Studies: [place] EKG SITE RITE  Result Date: 03/13/2020 If Site Rite image not attached, placement could not be confirmed due to current cardiac rhythm.   Scheduled Meds: . gatifloxacin  1 drop Both Eyes QID  . heparin  5,000 Units Subcutaneous Q8H  . methylPREDNISolone (SOLU-MEDROL) injection  40 mg Intravenous Q12H  . prednisoLONE acetate  1 drop Both Eyes QID   Continuous Infusions: . dextrose 5 % and 0.45% NaCl 125 mL/hr at 03/14/20 0400     LOS: 2 days    Time spent: 25 mins.    Cipriano Bunker, MD [facility]   If 7PM-7AM, please contact night-coverage

## 2020-03-14 NOTE — Discharge Summary (Signed)
Brief Transfer Summary  Rachel Compton YSA:[id number] DOB: Feb 07, 1991 DOA: 03/12/2020  PCP: [name], FNP  Admit date: 03/12/2020 Discharge date: 03/14/2020  Admitted From: Home  Disposition:  [facility]   Recommendations for Outpatient Follow-up:  1. Follow up with PCP in 1-2 weeks 2. Please obtain BMP/CBC in one week 3. Please follow up on the following pending results:  Discharge Condition: Guarded    CODE STATUS: Full    Brief/Interim Summary:   Rachel Compton is a 29 year old female who denies any significant past medical history and presented to the emergency department on 03/12/2020 with bilateral eye discomfort and discharge, rash, and dysuria. She had recently been started on doxycycline, worsened and was started on Macrobid, Diflucan, and Polytrim ophthalmic, had further worsening in her rash and overall malaise, and return to the ED. she was admitted to the hospital on 03/12/2020 with suspected Stevens-Johnson syndrome, started on IV fluids, pain control, IV antibiotics, and supportive measures. She was seen by infectious disease consultants with recommendations for short course IV steroids. Ophthalmology also consulted on the patient, recommended continued antibiotic ophthalmic ointment and referral to tertiary academic hospital. Prior to the admission to [facility], attempts had been made to admit the patient to an academic center but there were long wait lists. She was placed on a waiting list at [facility]. There was room for the patient to be transferred to [facility] the night of 03/14/2020 for ongoing care.   Discharge Diagnoses:  Principal Problem:   Stevens-Johnson syndrome (HCC)    Allergies  Allergen Reactions  . Penicillins Hives    Consultations:  - Carmela Rima, MD (ophthalmology) - Judyann Munson, MD (ID)   Procedures/Studies: [facility] Chest Port 1 View  Result Date: 03/12/2020 CLINICAL DATA:  Generalized fatigue with nausea and  vomiting for 3 days. EXAM: PORTABLE CHEST 1 VIEW COMPARISON:  None. FINDINGS: The cardiac silhouette, mediastinal and hilar contours are normal. Low lung volumes with streaky bibasilar atelectasis. No infiltrates or effusions. No pulmonary lesions. The bony thorax is intact. IMPRESSION: Low lung volumes with streaky bibasilar atelectasis. No definite infiltrates or effusions. Electronically Signed   By: Rudie Meyer M.D.   On: 03/12/2020 06:22   [place] EKG SITE RITE  Result Date: 03/13/2020 If Site Rite image not attached, placement could not be confirmed due to current cardiac rhythm.    Discharge Exam: Vitals:   03/14/20 1700 03/14/20 2035  BP: 113/82 111/83  Pulse: 67 75  Resp:    Temp: 98.8 F (37.1 C) 98.8 F (37.1 C)  SpO2: 100% 100%   Vitals:   03/14/20 1033 03/14/20 1228 03/14/20 1700 03/14/20 2035  BP: 103/76 101/77 113/82 111/83  Pulse: 67 63 67 75  Resp: 12 15    Temp: 99.2 F (37.3 C) 99.1 F (37.3 C) 98.8 F (37.1 C) 98.8 F (37.1 C)  TempSrc: Oral Oral Oral Oral  SpO2: 100% 100% 100% 100%  Weight:      Height:         The results of significant diagnostics from this hospitalization (including imaging, microbiology, ancillary and laboratory) are listed below for reference.     Microbiology: Recent Results (from the past 240 hour(s))  Urine Culture     Status: None   Collection Time: 03/10/20 10:04 AM   Specimen: Urine, Clean Catch  Result Value Ref Range Status   Specimen Description URINE, CLEAN CATCH  Final   Special Requests NONE  Final   Culture  Final    NO GROWTH Performed at [facility], [address], [place], [place] [address]    Report Status 03/11/2020 FINAL  Final  Culture, group A strep     Status: None   Collection Time: 03/10/20 10:04 AM   Specimen: Throat  Result Value Ref Range Status   Specimen Description THROAT  Final   Special Requests NONE  Final   Culture   Final    NO GROUP A STREP (S.PYOGENES) ISOLATED Performed  at [facility], [address] [place], [place] [address]    Report Status 03/12/2020 FINAL  Final  SARS Coronavirus 2 by RT PCR (hospital order, performed in [facility] hospital lab) Nasopharyngeal Nasopharyngeal Swab     Status: None   Collection Time: 03/12/20  2:44 AM   Specimen: Nasopharyngeal Swab  Result Value Ref Range Status   SARS Coronavirus 2 NEGATIVE NEGATIVE Final    Comment: (NOTE) SARS-CoV-2 target nucleic acids are NOT DETECTED.  The SARS-CoV-2 RNA is generally detectable in upper and lower respiratory specimens during the acute phase of infection. The lowest concentration of SARS-CoV-2 viral copies this assay can detect is 250 copies / mL. A negative result does not preclude SARS-CoV-2 infection and should not be used as the sole basis for treatment or other patient management decisions.  A negative result may occur with improper specimen collection / handling, submission of specimen other than nasopharyngeal swab, presence of viral mutation(s) within the areas targeted by this assay, and inadequate number of viral copies (<250 copies / mL). A negative result must be combined with clinical observations, patient history, and epidemiological information.  Fact Sheet for Patients:   BoilerBrush.com.cy  Fact Sheet for Healthcare Providers: https://pope.com/  This test is not yet approved or  cleared by the [place] FDA and has been authorized for detection and/or diagnosis of SARS-CoV-2 by FDA under an Emergency Use Authorization (EUA).  This EUA will remain in effect (meaning this test can be used) for the duration of the COVID-19 declaration under Section 564(b)(1) of the Act, 21 U.S.C. section 360bbb-3(b)(1), unless the authorization is terminated or revoked sooner.  Performed at [facility], [address] [place], [place] [address]   Wound or Superficial Culture     Status: None    Collection Time: 03/12/20  1:23 PM   Specimen: Eye  Result Value Ref Range Status   Specimen Description   Final    EYE LEFT Performed at [facility], [address] [place], [place] [address]    Special Requests   Final    Normal Performed at [facility], [address] [place], [place] [address]    Gram Stain NO WBC SEEN NO ORGANISMS SEEN   Final   Culture   Final    NORMAL SKIN FLORA Performed at [facility], [address], [place], [place] [address]    Report Status 03/14/2020 FINAL  Final  Gonococcus culture     Status: None   Collection Time: 03/12/20  1:26 PM   Specimen: Eye  Result Value Ref Range Status   Specimen Description   Final    EYE LEFT Performed at [facility], [address] [place], [place] [address]    Special Requests   Final    Normal Performed at [facility], [address] [place], [place] [address]    Culture  Final    NO GROWTH 2 DAYS Performed at [facility], [address] [place], [place] [address]    Report Status 03/14/2020 FINAL  Final  Wet prep, genital     Status: Abnormal   Collection Time: 03/12/20  2:50 PM   Specimen: PATH Cytology Cervicovaginal Ancillary Only  Result Value Ref Range Status   Yeast Wet Prep HPF POC NONE SEEN NONE SEEN Final   Trich, Wet Prep NONE SEEN NONE SEEN Final   Clue Cells Wet Prep HPF POC PRESENT (A) NONE SEEN Final   WBC, Wet Prep HPF POC MANY (A) NONE SEEN Final   Sperm NONE SEEN  Final    Comment: Performed at [facility] [place], [address] [place], [place] [address]  Culture, blood (routine x 2)     Status: None (Preliminary result)   Collection Time: 03/12/20  3:23 PM   Specimen: BLOOD  Result Value Ref Range Status   Specimen Description   Final    BLOOD LEFT ANTECUBITAL Performed at [facility], [address] [place], [place] [address]    Special Requests   Final    BOTTLES DRAWN AEROBIC AND ANAEROBIC  Blood Culture adequate volume Performed at [facility] [place], [address] [place], [place] [address]    Culture   Final    NO GROWTH 2 DAYS Performed at [facility], [address] [place], [place] [address]    Report Status PENDING  Incomplete  Culture, blood (routine x 2)     Status: None (Preliminary result)   Collection Time: 03/12/20  3:25 PM   Specimen: BLOOD  Result Value Ref Range Status   Specimen Description   Final    BLOOD RIGHT ANTECUBITAL Performed at [facility], [address] [place], [place] [address]    Special Requests   Final    BOTTLES DRAWN AEROBIC AND ANAEROBIC Blood Culture adequate volume Performed at [facility] [place], [address] [place], [place] [address]    Culture   Final    NO GROWTH 2 DAYS Performed at [facility], [address] [place], [place] [address]    Report Status PENDING  Incomplete     Labs: BNP (last 3 results) No results for input(s): BNP in the last 8760 hours. Basic Metabolic Panel: Recent Labs  Lab 03/12/20 0244 03/13/20 0338 03/14/20 0323  NA 133* 137 138  K 3.4* 3.7 4.0  CL 98 105 105  CO2 23 22 24   GLUCOSE 120* 130* 167*  BUN 17 9 16   CREATININE 1.05* 1.00 0.86  CALCIUM 9.1 8.5* 8.7*  MG  --   --  2.3  PHOS  --   --  4.9*   Liver Function Tests: Recent Labs  Lab 03/12/20 0244 03/13/20 0338 03/14/20 0323  AST 40 28 24  ALT 50* 37 32  ALKPHOS 71 52 53  BILITOT 0.5 0.7 0.3  PROT 8.0 6.4* 6.5  ALBUMIN 3.6 2.7* 2.5*   No results for input(s): LIPASE, AMYLASE in the last 168 hours. No results for input(s): AMMONIA in the last 168 hours. CBC: Recent Labs  Lab 03/12/20 0244 03/13/20 0338 03/14/20 0323  WBC 8.7 8.3 6.1  NEUTROABS 7.1  --   --   HGB 13.8 12.5 12.7  HCT 41.0 37.8 38.7  MCV 87.8 88.7 88.4  PLT 265 219 245   Cardiac Enzymes: No results for input(s):  CKTOTAL, CKMB, CKMBINDEX, TROPONINI in the last 168 hours. BNP: Invalid input(s):  POCBNP CBG: No results for input(s): GLUCAP in the last 168 hours. D-Dimer No results for input(s): DDIMER in the last 72 hours. Hgb A1c No results for input(s): HGBA1C in the last 72 hours. Lipid Profile No results for input(s): CHOL, HDL, LDLCALC, TRIG, CHOLHDL, LDLDIRECT in the last 72 hours. Thyroid function studies No results for input(s): TSH, T4TOTAL, T3FREE, THYROIDAB in the last 72 hours.  Invalid input(s): FREET3 Anemia work up No results for input(s): VITAMINB12, FOLATE, FERRITIN, TIBC, IRON, RETICCTPCT in the last 72 hours. Urinalysis    Component Value Date/Time   COLORURINE YELLOW 03/12/2020 0244   APPEARANCEUR CLOUDY (A) 03/12/2020 0244   LABSPEC 1.020 03/12/2020 0244   PHURINE 6.0 03/12/2020 0244   GLUCOSEU NEGATIVE 03/12/2020 0244   HGBUR LARGE (A) 03/12/2020 0244   BILIRUBINUR NEGATIVE 03/12/2020 0244   BILIRUBINUR large (A) 03/10/2020 0949   KETONESUR 40 (A) 03/12/2020 0244   PROTEINUR 30 (A) 03/12/2020 0244   UROBILINOGEN >=8.0 (A) 03/10/2020 0949   NITRITE NEGATIVE 03/12/2020 0244   LEUKOCYTESUR MODERATE (A) 03/12/2020 0244   Sepsis Labs Invalid input(s): PROCALCITONIN,  WBC,  LACTICIDVEN Microbiology Recent Results (from the past 240 hour(s))  Urine Culture     Status: None   Collection Time: 03/10/20 10:04 AM   Specimen: Urine, Clean Catch  Result Value Ref Range Status   Specimen Description URINE, CLEAN CATCH  Final   Special Requests NONE  Final   Culture   Final    NO GROWTH Performed at [facility], [address], [place], [place] [address]    Report Status 03/11/2020 FINAL  Final  Culture, group A strep     Status: None   Collection Time: 03/10/20 10:04 AM   Specimen: Throat  Result Value Ref Range Status   Specimen Description THROAT  Final   Special Requests NONE  Final   Culture   Final    NO GROUP A STREP (S.PYOGENES) ISOLATED Performed at [facility], [address], [place], [place] [address]    Report Status  03/12/2020 FINAL  Final  SARS Coronavirus 2 by RT PCR (hospital order, performed in [facility] hospital lab) Nasopharyngeal Nasopharyngeal Swab     Status: None   Collection Time: 03/12/20  2:44 AM   Specimen: Nasopharyngeal Swab  Result Value Ref Range Status   SARS Coronavirus 2 NEGATIVE NEGATIVE Final    Comment: (NOTE) SARS-CoV-2 target nucleic acids are NOT DETECTED.  The SARS-CoV-2 RNA is generally detectable in upper and lower respiratory specimens during the acute phase of infection. The lowest concentration of SARS-CoV-2 viral copies this assay can detect is 250 copies / mL. A negative result does not preclude SARS-CoV-2 infection and should not be used as the sole basis for treatment or other patient management decisions.  A negative result may occur with improper specimen collection / handling, submission of specimen other than nasopharyngeal swab, presence of viral mutation(s) within the areas targeted by this assay, and inadequate number of viral copies (<250 copies / mL). A negative result must be combined with clinical observations, patient history, and epidemiological information.  Fact Sheet for Patients:   BoilerBrush.com.cy  Fact Sheet for Healthcare Providers: https://pope.com/  This test is not yet approved or  cleared by the [place] FDA and has been authorized for detection and/or diagnosis of SARS-CoV-2 by FDA under an Emergency Use Authorization (EUA).  This EUA will remain  in effect (meaning this test can be used) for the duration of the COVID-19 declaration under Section 564(b)(1) of the Act, 21 U.S.C. section 360bbb-3(b)(1), unless the authorization is terminated or revoked sooner.  Performed at [facility], [address] [place], [place] [address]   Wound or Superficial Culture     Status: None   Collection Time: 03/12/20  1:23 PM   Specimen: Eye  Result Value Ref Range Status    Specimen Description   Final    EYE LEFT Performed at [facility], [address] [place], [place] [address]    Special Requests   Final    Normal Performed at [facility], [address] [place], [place] [address]    Gram Stain NO WBC SEEN NO ORGANISMS SEEN   Final   Culture   Final    NORMAL SKIN FLORA Performed at [facility], [address], [place], [place] [address]    Report Status 03/14/2020 FINAL  Final  Gonococcus culture     Status: None   Collection Time: 03/12/20  1:26 PM   Specimen: Eye  Result Value Ref Range Status   Specimen Description   Final    EYE LEFT Performed at [facility], [address] [place], [place] [address]    Special Requests   Final    Normal Performed at [facility], [address] [place], [place] [address]    Culture   Final    NO GROWTH 2 DAYS Performed at [facility], [address] [place] [address], [place], [place] [address]    Report Status 03/14/2020 FINAL  Final  Wet prep, genital     Status: Abnormal   Collection Time: 03/12/20  2:50 PM   Specimen: PATH Cytology Cervicovaginal Ancillary Only  Result Value Ref Range Status   Yeast Wet Prep HPF POC NONE SEEN NONE SEEN Final   Trich, Wet Prep NONE SEEN NONE SEEN Final   Clue Cells Wet Prep HPF POC PRESENT (A) NONE SEEN Final   WBC, Wet Prep HPF POC MANY (A) NONE SEEN Final   Sperm NONE SEEN  Final    Comment: Performed at [facility], [address] [place], [place] [address]  Culture, blood (routine x 2)     Status: None (Preliminary result)   Collection Time: 03/12/20  3:23 PM   Specimen: BLOOD  Result Value Ref Range Status   Specimen Description   Final    BLOOD LEFT ANTECUBITAL Performed at [facility], [address], [place], [place] [address]    Special Requests   Final    BOTTLES DRAWN AEROBIC AND ANAEROBIC Blood Culture adequate volume Performed at [facility], [address]  [place], [place] [address]    Culture   Final    NO GROWTH 2 DAYS Performed at [facility], [address], [place], [place] [address]    Report Status PENDING  Incomplete  Culture, blood (routine x 2)     Status: None (Preliminary result)   Collection Time: 03/12/20  3:25 PM   Specimen: BLOOD  Result Value Ref Range Status   Specimen Description   Final    BLOOD RIGHT ANTECUBITAL Performed at [facility], [address] [place], [place] [address]    Special Requests   Final    BOTTLES DRAWN AEROBIC AND ANAEROBIC  Blood Culture adequate volume Performed at [facility], [address] [place], [place] [address]    Culture   Final    NO GROWTH 2 DAYS Performed at [facility], [address], [place], [place] [address]    Report Status PENDING  Incomplete     Time coordinating discharge: Over 30 minutes  SIGNED:   Briscoe Deutscher, MD  [facility] 03/14/2020, 11:32 PM Pager   If 7PM-7AM, please contact night-coverage [email or web address] Password [email or web address]

## 2020-03-14 NOTE — Progress Notes (Signed)
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 Hard to open her mouth.  Lesions on her skin are itchy.  Eyes swollen and unable to open comfortably.  Fever curve declining after starting steroids.  No positive cultures including gonococcal culture.    Physical Exam: Constitutional:  Vitals:   03/14/20 1033 03/14/20 1228  BP: 103/76 101/77  Pulse: 67 63  Resp: 12 15  Temp: 99.2 F (37.3 C) 99.1 F (37.3 C)  SpO2: 100% 100%   Eyes: conjuncitivitis HENT: diffuse mucous membrane involvement Respiratory: Normal respiratory effort; CTA B Cardiovascular: RRR Skin: diffuse papular lesions with central umbilication  Review of Systems: Constitutional: positive for fevers and chills Respiratory: negative for cough or sputum  Lab Results  Component Value Date   WBC 6.1 03/14/2020   HGB 12.7 03/14/2020   HCT 38.7 03/14/2020   MCV 88.4 03/14/2020   PLT 245 03/14/2020    Lab Results  Component Value Date   CREATININE 0.86 03/14/2020   BUN 16 03/14/2020   NA 138 03/14/2020   K 4.0 03/14/2020   CL 105 03/14/2020   CO2 24 03/14/2020    Lab Results  Component Value Date   ALT 32 03/14/2020   AST 24 03/14/2020   ALKPHOS 53 03/14/2020     Microbiology: Recent Results (from the past 240 hour(s))  Urine Culture     Status: None   Collection Time: 03/10/20 10:04 AM   Specimen: Urine, Clean Catch  Result Value Ref Range Status   Specimen Description URINE, CLEAN CATCH  Final   Special Requests NONE  Final   Culture   Final    NO GROWTH Performed at [facility], [address] [place], [place] [address]    Report Status 03/11/2020 FINAL  Final  Culture, group A strep     Status: None   Collection Time: 03/10/20 10:04 AM   Specimen: Throat  Result Value Ref Range Status   Specimen Description THROAT  Final   Special Requests NONE  Final   Culture   Final    NO GROUP A  STREP (S.PYOGENES) ISOLATED Performed at [facility], [address], [place], [place] [address]    Report Status 03/12/2020 FINAL  Final  SARS Coronavirus 2 by RT PCR (hospital order, performed in [facility] hospital lab) Nasopharyngeal Nasopharyngeal Swab     Status: None   Collection Time: 03/12/20  2:44 AM   Specimen: Nasopharyngeal Swab  Result Value Ref Range Status   SARS Coronavirus 2 NEGATIVE NEGATIVE Final    Comment: (NOTE) SARS-CoV-2 target nucleic acids are NOT DETECTED.  The SARS-CoV-2 RNA is generally detectable in upper and lower respiratory specimens during the acute phase of infection. The lowest concentration of SARS-CoV-2 viral copies this assay can detect is 250 copies / mL. A negative result does not preclude SARS-CoV-2 infection and should not be used as the sole basis for treatment or other patient management decisions.  A negative result may occur with improper specimen collection / handling, submission of specimen other than nasopharyngeal swab, presence of viral mutation(s) within the areas targeted by this assay, and inadequate number of viral copies (<250 copies / mL). A negative result must be combined with clinical observations, patient history, and epidemiological information.  Fact Sheet for Patients:   BoilerBrush.com.cy  Fact Sheet for Healthcare Providers: https://pope.com/  This test is not yet approved  or  cleared by the [place] and has been authorized for detection and/or diagnosis of SARS-CoV-2 by FDA under an Emergency Use Authorization (EUA).  This EUA will remain in effect (meaning this test can be used) for the duration of the COVID-19 declaration under Section 564(b)(1) of the Act, 21 U.S.C. section 360bbb-3(b)(1), unless the authorization is terminated or revoked sooner.  Performed at [facility], [address] [place], [place] [address]   Wound or  Superficial Culture     Status: None   Collection Time: 03/12/20  1:23 PM   Specimen: Eye  Result Value Ref Range Status   Specimen Description   Final    EYE LEFT Performed at [facility], [address] [place], [place] [address]    Special Requests   Final    Normal Performed at [facility], [address] [place], [place] [address]    Gram Stain NO WBC SEEN NO ORGANISMS SEEN   Final   Culture   Final    NORMAL SKIN FLORA Performed at [facility], [address], [place], [place] [address]    Report Status 03/14/2020 FINAL  Final  Gonococcus culture     Status: None   Collection Time: 03/12/20  1:26 PM   Specimen: Eye  Result Value Ref Range Status   Specimen Description   Final    EYE LEFT Performed at [facility], [address] [place], [place] [address]    Special Requests   Final    Normal Performed at [facility], [address] [place], [place] [address]    Culture   Final    NO GROWTH 2 DAYS Performed at [facility], [address] [place], [place] [address]    Report Status 03/14/2020 FINAL  Final  Wet prep, genital     Status: Abnormal   Collection Time: 03/12/20  2:50 PM   Specimen: PATH Cytology Cervicovaginal Ancillary Only  Result Value Ref Range Status   Yeast Wet Prep HPF POC NONE SEEN NONE SEEN Final   Trich, Wet Prep NONE SEEN NONE SEEN Final   Clue Cells Wet Prep HPF POC PRESENT (A) NONE SEEN Final   WBC, Wet Prep HPF POC MANY (A) NONE SEEN Final   Sperm NONE SEEN  Final    Comment: Performed at [facility], [address] [place], [place] [address]  Culture, blood (routine x 2)     Status: None (Preliminary result)   Collection Time: 03/12/20  3:23 PM   Specimen: BLOOD  Result Value Ref Range Status   Specimen Description   Final    BLOOD LEFT ANTECUBITAL Performed at [facility], [address], [place], [place] [address]    Special Requests   Final     BOTTLES DRAWN AEROBIC AND ANAEROBIC Blood Culture adequate volume Performed at [facility], [address] [place], [place] [address]    Culture   Final    NO GROWTH 2 DAYS Performed at [facility], [address] [place], [place] [address]    Report Status PENDING  Incomplete  Culture, blood (routine x 2)     Status: None (Preliminary result)   Collection Time: 03/12/20  3:25 PM   Specimen: BLOOD  Result Value Ref Range Status   Specimen Description   Final    BLOOD  RIGHT ANTECUBITAL Performed at [facility], [address], [place], [place] [address]    Special Requests   Final    BOTTLES DRAWN AEROBIC AND ANAEROBIC Blood Culture adequate volume Performed at [facility], [address] [place], [place] [address]    Culture   Final    NO GROWTH 2 DAYS Performed at [facility], [address], [place], [place] [address]    Report Status PENDING  Incomplete    Impression/Plan:  1. Rash with mucous membrane involvement - differential includes SJS/TEN, disseminated GC, disseminated fungal infection (though I would not expect such extensive MM involvement), other non-infectious etiologies like drug rash.  Her fever has declined on the steroids so may be helping.  Clinically though she has not otherwise improved. Her cultures do not suggest an infection etiology.  HIV, RPR negative.   I agree with transfer to an academic center when/if possible. I recommend biopsy of one of the lesions if not to be transferred soon.  Continue with steroids  2.  Fluid management - not eating or drinking so will need ongoing fluid management.   3.  Conjunctivitis - as above, differential broad.  Getting eye care per ophthalmology.

## 2020-03-15 DIAGNOSIS — R21 Rash and other nonspecific skin eruption: Secondary | ICD-10-CM | POA: Insufficient documentation

## 2020-03-15 NOTE — Progress Notes (Signed)
Report called to Rayford Halsted , pt is going to room 7309. Pt made aware she is transferring to [facility], pt notified her husband.

## 2020-03-15 NOTE — Progress Notes (Signed)
Called received from Tamela from [facility] stating Pt has bed available, and gave number to call back with list that pt will need during discharge.

## 2020-03-15 NOTE — Progress Notes (Signed)
Care link by bedside, report given to care link. Pt just had her cell phone with her, not other belongings. Pt transferred via care link, family aware. PIV to right hand .  Vitals stable at the time of transfer.

## 2020-03-17 LAB — CULTURE, BLOOD (ROUTINE X 2)
Culture: NO GROWTH
Culture: NO GROWTH
Special Requests: ADEQUATE
Special Requests: ADEQUATE

## 2020-03-18 ENCOUNTER — Telehealth: Payer: Self-pay

## 2020-03-18 NOTE — Telephone Encounter (Signed)
2nd attempt to call patient and schedule a hospital follow up. Left a message to call back and schedule an appointment.

## 2020-03-18 NOTE — Telephone Encounter (Signed)
-----   Message from [name], FNP sent at 03/17/2020 12:00 PM EDT ----- CALL-  Normal blood culture. Please schedule hospital follow up.   Let me know if cannot reach patient.

## 2020-03-22 DIAGNOSIS — H1089 Other conjunctivitis: Secondary | ICD-10-CM | POA: Insufficient documentation

## 2020-03-24 ENCOUNTER — Telehealth: Payer: Self-pay | Admitting: Family

## 2020-03-24 NOTE — Telephone Encounter (Signed)
Call pt Yes get more info; I know she was hospitalized for stevens johnson Where are stitches? How may are there?  Please ensure stiches are not internal or related to any sort of drain as that would need to be addressed by surgery consult   we need to wait 7-10 days since placed unless pt was told otherwise; I dont see when place or discharge summary from [facility]

## 2020-03-24 NOTE — Telephone Encounter (Signed)
Are you okay with this or do I need to find out more info?

## 2020-03-24 NOTE — Telephone Encounter (Signed)
LMTCB to get more info 

## 2020-03-24 NOTE — Telephone Encounter (Signed)
Dr.Lianares called from [facility] to schedule follow up appointment for patient she  Is being discharged today 8-24 has stitches  in left leg that need removed.  Her appt is scheduled for 8/25 2p

## 2020-03-25 ENCOUNTER — Other Ambulatory Visit: Payer: Self-pay

## 2020-03-25 ENCOUNTER — Encounter: Payer: Self-pay | Admitting: Family

## 2020-03-25 ENCOUNTER — Ambulatory Visit (INDEPENDENT_AMBULATORY_CARE_PROVIDER_SITE_OTHER): Payer: BC Managed Care – PPO | Admitting: Family

## 2020-03-25 VITALS — BP 98/70 | HR 97 | Temp 98.5°F | Ht 68.0 in | Wt 166.2 lb

## 2020-03-25 DIAGNOSIS — L511 Stevens-Johnson syndrome: Secondary | ICD-10-CM | POA: Diagnosis not present

## 2020-03-25 DIAGNOSIS — G43909 Migraine, unspecified, not intractable, without status migrainosus: Secondary | ICD-10-CM | POA: Insufficient documentation

## 2020-03-25 NOTE — Progress Notes (Signed)
Subjective:    Patient ID: Rachel Compton, female    DOB: May 31, 1991, 29 y.o.   MRN: [id number]  CC: Rachel Compton is a 29 y.o. female who presents today for hospital follow up.   HPI: Discharged yesterday from [place] well today No new concerns She saw her ophthalmologist yesterday. No blurry vision. Using eye drops since contacts to protect eyes are quite drying. Wearing sunglasses Rash has improved and new new lesions Eating and drinking well. Mouth is still some sore.   Describes rash started in [place] 03/05/20. Initially lesion left medial thigh that progressed. Originially seen at urgent care in [place] and diagnosed with uti, conjunctivitis, and strep throat.  Never really sure if this was medication reaction or not.  Would like 2 sutures removed from left calf where biopsy was. No purulent discharge or pain from area.   On prednisone taper. Hasnt heard from dermatology.    Hospital Discharge: Biopsy of skin lesion 8/15 most consistent with erythema multiforme Need stitch from biopsy left calf removed since it has been 10 days ( per discharge to be removed 03/25/20) Needs follow up with dermatology for f/u prolonged steriod taper  H[facility]ilizaed at [facility] 03/15/20 - 03/24/20 for new numerous bullous skin lesions consistent with erythema multiform with ocular involvment;  transferred from [facility]   Dr Sharen Counter ophthalmology 03/24/20 membranos conjunctivitis  Dermatology has diagnosed: erythema multiform vs reactive infectious mucocutaneous eruption (RIME)  Negative blood culture 03/15/20 Elevated sed rate, crp, + ANA Negative HSV 1 and 2 Mycoplasma Ab IgG + but IgM  VZV negative  Ab XR no acute abnormality   HISTORY:  Past Medical History:  Diagnosis Date  . Asthma   . Chicken pox   . Migraine    04/22/08 MRI normal    Past Surgical History:  Procedure Laterality Date  . NO PAST SURGERIES     Family History  Adopted: Yes  Problem  Relation Age of Onset  . Cancer Neg Hx   . Diabetes Neg Hx   . Hyperlipidemia Neg Hx   . Hypertension Neg Hx   . Thyroid disease Neg Hx     Allergies: Penicillins Current Outpatient Medications on File Prior to Visit  Medication Sig Dispense Refill  . Calcium Carbonate-Vitamin D 600-400 MG-UNIT tablet Take 1 tablet by mouth daily with breakfast.    . Drospirenone-Ethinyl Estradiol-Levomefol (BEYAZ) 3-0.02-0.451 MG tablet Take 1 tablet by mouth daily.    Marland Kitchen ofloxacin (OCUFLOX) 0.3 % ophthalmic solution     . oxyCODONE (OXY IR/ROXICODONE) 5 MG immediate release tablet Take by mouth.    . prednisoLONE acetate (PRED FORTE) 1 % ophthalmic suspension     . [START ON 03/26/2020] predniSONE (DELTASONE) 10 MG tablet Take by mouth.    . senna-docusate (SENOKOT-S) 8.6-50 MG tablet Take by mouth.    . [DISCONTINUED] fexofenadine (ALLEGRA ALLERGY) 60 MG tablet Take 1 tablet (60 mg total) by mouth 2 (two) times daily. 120 tablet 1   No current facility-administered medications on file prior to visit.    Social History   Tobacco Use  . Smoking status: Never Smoker  . Smokeless tobacco: Never Used  Vaping Use  . Vaping Use: Former  Substance Use Topics  . Alcohol use: Yes    Alcohol/week: 5.0 standard drinks    Types: 5 Standard drinks or equivalent per week  . Drug use: No    Review of Systems  Constitutional: Negative for chills and fever.  HENT:  Positive for sore throat.   Respiratory: Negative for cough.   Cardiovascular: Negative for chest pain and palpitations.  Gastrointestinal: Negative for abdominal distention, nausea and vomiting.  Skin: Positive for rash and wound.      Objective:    BP 98/70   Pulse 97   Temp 98.5 F (36.9 C) (Oral)   Ht 5\' 8"  (1.727 m)   Wt 166 lb 3.2 oz (75.4 kg)   LMP 03/11/2020 (LMP Unknown)   SpO2 98%   BMI 25.27 kg/m  BP Readings from Last 3 Encounters:  03/25/20 98/70  03/15/20 101/69  03/11/20 104/73   Wt Readings from Last 3  Encounters:  03/25/20 166 lb 3.2 oz (75.4 kg)  03/11/20 170 lb (77.1 kg)  12/02/19 174 lb (78.9 kg)    Physical Exam Vitals reviewed.  Constitutional:      Appearance: She is well-developed.  Eyes:     Conjunctiva/sclera: Conjunctivae normal.  Cardiovascular:     Rate and Rhythm: Normal rate and regular rhythm.     Pulses: Normal pulses.     Heart sounds: Normal heart sounds.  Pulmonary:     Effort: Pulmonary effort is normal.     Breath sounds: Normal breath sounds. No wheezing, rhonchi or rales.  Skin:    General: Skin is warm and dry.     Comments: Scattered scabs noted bilateral legs approximately quarter in size.   Neurological:     Mental Status: She is alert.  Psychiatric:        Speech: Speech normal.        Behavior: Behavior normal.        Thought Content: Thought content normal.     with patient's consent, 2 sutures removed from lateral aspect of left calf.   Sutures removed in their entirety. Inspected skin did not appreciate any remaining sutures. Wound is well approximated, no draining or purulent discharge. Localized erythema, swelling mild proximal to laceration. No red streaking, increased warmth.     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 Patient feeling well today and no new lesions. She will continue prednisone taper and referral placed for dermatology follow up asap. She will continue to follow closely with ophthalmology.       Relevant Orders   Comprehensive metabolic panel   CBC with Differential/Platelet   Ambulatory referral to Dermatology       I am having Francesca Jewett maintain her Drospirenone-Ethinyl Estradiol-Levomefol, ofloxacin, oxyCODONE, prednisoLONE acetate, predniSONE, senna-docusate, and Calcium Carbonate-Vitamin D.   No orders of the defined types were placed in this  encounter.   Return precautions given.   Risks, benefits, and alternatives of the medications and treatment plan prescribed today were discussed, and patient expressed understanding.   Education regarding symptom management and diagnosis given to patient on AVS.  Continue to follow with Burnard Hawthorne, FNP for routine health maintenance.   Rachel Compton and I agreed with plan.   Mable Paris, FNP

## 2020-03-25 NOTE — Telephone Encounter (Signed)
Patient seen in office today. 

## 2020-03-25 NOTE — Patient Instructions (Addendum)
Nice to see you today Continue prednisone.  Let me know of any concerns  Referral placed to dermaology Let us know if you dont hear back within a week in regards to an appointment being scheduled.

## 2020-03-25 NOTE — Assessment & Plan Note (Signed)
Unclear whether erythema multiforme or SJS. Patient feeling well today and no new lesions. She will continue prednisone taper and referral placed for dermatology follow up asap. She will continue to follow closely with ophthalmology.

## 2020-03-26 LAB — CBC WITH DIFFERENTIAL/PLATELET
Basophils Absolute: 0 10*3/uL (ref 0.0–0.1)
Basophils Relative: 0.2 % (ref 0.0–3.0)
Eosinophils Absolute: 0 10*3/uL (ref 0.0–0.7)
Eosinophils Relative: 0 % (ref 0.0–5.0)
HCT: 39.2 % (ref 36.0–46.0)
Hemoglobin: 13.2 g/dL (ref 12.0–15.0)
Lymphocytes Relative: 7.5 % — ABNORMAL LOW (ref 12.0–46.0)
Lymphs Abs: 0.8 10*3/uL (ref 0.7–4.0)
MCHC: 33.6 g/dL (ref 30.0–36.0)
MCV: 90.8 fl (ref 78.0–100.0)
Monocytes Absolute: 0.1 10*3/uL (ref 0.1–1.0)
Monocytes Relative: 1.1 % — ABNORMAL LOW (ref 3.0–12.0)
Neutro Abs: 9.5 10*3/uL — ABNORMAL HIGH (ref 1.4–7.7)
Neutrophils Relative %: 91.2 % — ABNORMAL HIGH (ref 43.0–77.0)
Platelets: 396 10*3/uL (ref 150.0–400.0)
RBC: 4.32 Mil/uL (ref 3.87–5.11)
RDW: 13.2 % (ref 11.5–15.5)
WBC: 10.4 10*3/uL (ref 4.0–10.5)

## 2020-03-26 LAB — COMPREHENSIVE METABOLIC PANEL
ALT: 34 U/L (ref 0–35)
AST: 14 U/L (ref 0–37)
Albumin: 3.9 g/dL (ref 3.5–5.2)
Alkaline Phosphatase: 56 U/L (ref 39–117)
BUN: 10 mg/dL (ref 6–23)
CO2: 30 mEq/L (ref 19–32)
Calcium: 9.5 mg/dL (ref 8.4–10.5)
Chloride: 99 mEq/L (ref 96–112)
Creatinine, Ser: 0.88 mg/dL (ref 0.40–1.20)
GFR: 74.91 mL/min (ref >60.00)
Glucose, Bld: 116 mg/dL — ABNORMAL HIGH (ref 70–99)
Potassium: 3.6 mEq/L (ref 3.5–5.1)
Sodium: 139 mEq/L (ref 135–145)
Total Bilirubin: 0.4 mg/dL (ref 0.2–1.2)
Total Protein: 6.9 g/dL (ref 6.0–8.3)

## 2020-03-31 ENCOUNTER — Telehealth: Payer: Self-pay

## 2020-03-31 ENCOUNTER — Other Ambulatory Visit: Payer: Self-pay | Admitting: Family

## 2020-03-31 DIAGNOSIS — R7989 Other specified abnormal findings of blood chemistry: Secondary | ICD-10-CM

## 2020-03-31 NOTE — Telephone Encounter (Signed)
LMTCB for labs & to schedule repeat CBC in two weeks.

## 2020-04-09 ENCOUNTER — Telehealth: Payer: Self-pay | Admitting: Family

## 2020-04-09 ENCOUNTER — Other Ambulatory Visit: Payer: Self-pay

## 2020-04-09 ENCOUNTER — Other Ambulatory Visit (INDEPENDENT_AMBULATORY_CARE_PROVIDER_SITE_OTHER): Payer: BC Managed Care – PPO

## 2020-04-09 DIAGNOSIS — R7989 Other specified abnormal findings of blood chemistry: Secondary | ICD-10-CM | POA: Diagnosis not present

## 2020-04-09 LAB — CBC WITH DIFFERENTIAL/PLATELET
Basophils Absolute: 0 10*3/uL (ref 0.0–0.1)
Basophils Relative: 0.1 % (ref 0.0–3.0)
Eosinophils Absolute: 0 10*3/uL (ref 0.0–0.7)
Eosinophils Relative: 0.1 % (ref 0.0–5.0)
HCT: 40.6 % (ref 36.0–46.0)
Hemoglobin: 13.5 g/dL (ref 12.0–15.0)
Lymphocytes Relative: 6 % — ABNORMAL LOW (ref 12.0–46.0)
Lymphs Abs: 0.8 10*3/uL (ref 0.7–4.0)
MCHC: 33.2 g/dL (ref 30.0–36.0)
MCV: 91.6 fl (ref 78.0–100.0)
Monocytes Absolute: 0.2 10*3/uL (ref 0.1–1.0)
Monocytes Relative: 1.2 % — ABNORMAL LOW (ref 3.0–12.0)
Neutro Abs: 11.8 10*3/uL — ABNORMAL HIGH (ref 1.4–7.7)
Neutrophils Relative %: 92.6 % — ABNORMAL HIGH (ref 43.0–77.0)
Platelets: 229 10*3/uL (ref 150.0–400.0)
RBC: 4.43 Mil/uL (ref 3.87–5.11)
RDW: 14.9 % (ref 11.5–15.5)
WBC: 12.7 10*3/uL — ABNORMAL HIGH (ref 4.0–10.5)

## 2020-04-09 NOTE — Telephone Encounter (Signed)
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

## 2020-04-09 NOTE — Telephone Encounter (Signed)
I have patient paperwork & email address to mail once I can get Dr. Darrick Huntsman to sign form.

## 2020-04-10 ENCOUNTER — Telehealth: Payer: Self-pay

## 2020-04-10 MED ORDER — PREDNISONE 10 MG PO TABS: 10.0000 mg | ORAL_TABLET | Freq: Every day | ORAL | 0 refills | Status: DC

## 2020-04-10 NOTE — Telephone Encounter (Signed)
I called patient to let her know that I had emailed her form that was needed for her to keep her insurance with employer. I have left original copy up front for patient to pick up.

## 2020-04-10 NOTE — Telephone Encounter (Signed)
Spoke with patient about labs  Feels well today and even better as she decreases prednisone. No new lesions.  No fever, chills, dysuria, cp, cough. No new rash or skin lesions.   Has been on  taper of prednisone which started 80mg  when dced from [facility]. On 9/7  ( when labs drawn) , she was on 40mg , and then today 30mg .   tomorrow starts 20 mg x 3 days, and then 10 mg tablet for 3 days, then stops.  She is short 9 pills when she follows [facility] D/C instructions. Would like nine 10 mg pills. She completes taper on 9/16    Saw dermatology 8/30, Dr . F/u scheduled for later in the month.   Saw ophthalmology 3 days ago.   We agreed that we felt comfortable trending CBC as suspect elevated WBC, neutrophilis and depressed lymphocytes, monocytes related to prednisone. She is scheduled repeat lab on 9/30.

## 2020-04-30 ENCOUNTER — Other Ambulatory Visit (INDEPENDENT_AMBULATORY_CARE_PROVIDER_SITE_OTHER): Payer: BC Managed Care – PPO

## 2020-04-30 ENCOUNTER — Other Ambulatory Visit: Payer: Self-pay

## 2020-04-30 DIAGNOSIS — R7989 Other specified abnormal findings of blood chemistry: Secondary | ICD-10-CM

## 2020-05-01 LAB — CBC WITH DIFFERENTIAL/PLATELET
Basophils Absolute: 0.1 10*3/uL (ref 0.0–0.1)
Basophils Relative: 0.8 % (ref 0.0–3.0)
Eosinophils Absolute: 0.1 10*3/uL (ref 0.0–0.7)
Eosinophils Relative: 1.7 % (ref 0.0–5.0)
HCT: 36.8 % (ref 36.0–46.0)
Hemoglobin: 12.3 g/dL (ref 12.0–15.0)
Lymphocytes Relative: 26.8 % (ref 12.0–46.0)
Lymphs Abs: 2 10*3/uL (ref 0.7–4.0)
MCHC: 33.4 g/dL (ref 30.0–36.0)
MCV: 92.9 fl (ref 78.0–100.0)
Monocytes Absolute: 0.5 10*3/uL (ref 0.1–1.0)
Monocytes Relative: 6.9 % (ref 3.0–12.0)
Neutro Abs: 4.8 10*3/uL (ref 1.4–7.7)
Neutrophils Relative %: 63.8 % (ref 43.0–77.0)
Platelets: 303 10*3/uL (ref 150.0–400.0)
RBC: 3.96 Mil/uL (ref 3.87–5.11)
RDW: 14.5 % (ref 11.5–15.5)
WBC: 7.5 10*3/uL (ref 4.0–10.5)

## 2020-05-06 ENCOUNTER — Telehealth: Payer: BC Managed Care – PPO | Admitting: Family

## 2020-05-06 DIAGNOSIS — B3731 Acute candidiasis of vulva and vagina: Secondary | ICD-10-CM

## 2020-05-06 DIAGNOSIS — B373 Candidiasis of vulva and vagina: Secondary | ICD-10-CM

## 2020-05-06 MED ORDER — FLUCONAZOLE 150 MG PO TABS: 150.0000 mg | ORAL_TABLET | ORAL | 0 refills | Status: DC | PRN

## 2020-05-06 NOTE — Progress Notes (Signed)

## 2020-05-11 ENCOUNTER — Encounter: Payer: Self-pay | Admitting: Family

## 2020-05-11 ENCOUNTER — Other Ambulatory Visit: Payer: Self-pay

## 2020-05-11 ENCOUNTER — Ambulatory Visit (INDEPENDENT_AMBULATORY_CARE_PROVIDER_SITE_OTHER): Payer: BC Managed Care – PPO | Admitting: Family

## 2020-05-11 ENCOUNTER — Other Ambulatory Visit (HOSPITAL_COMMUNITY)
Admission: RE | Admit: 2020-05-11 | Discharge: 2020-05-11 | Disposition: A | Discharge status: Home / Self Care | Payer: BC Managed Care – PPO | Source: Ambulatory Visit | Attending: Family | Admitting: Family

## 2020-05-11 VITALS — BP 100/72 | HR 77 | Temp 98.2°F | Ht 68.0 in | Wt 170.2 lb

## 2020-05-11 DIAGNOSIS — N898 Other specified noninflammatory disorders of vagina: Secondary | ICD-10-CM | POA: Insufficient documentation

## 2020-05-11 NOTE — Patient Instructions (Signed)
Lets await on specimen and see if any yeast identified   Let me know how you are doing

## 2020-05-11 NOTE — Assessment & Plan Note (Signed)
Some improvement. No overt signs of candida although considering vaginal dryness to be reason for intense pruritus. Opted to wait on vaginal swab today ahead of treatment. May consider around course of diflucan or topical corticosteroid.

## 2020-05-11 NOTE — Progress Notes (Signed)
Subjective:    Patient ID: Rachel Compton, female    DOB: [birth year], 29 y.o.   MRN: [id number]  CC: Rachel Compton is a 29 y.o. female who presents today for an acute visit.    HPI: CC: Vaginal itching x one week, improved.   No relief with monistat and actually made it worse. Took diflucan once and the following day , took difucan again 4 day ago, with some relief. Continues to have itching externally and internally. No increase in vaginal discharge. No pelvic pain, abdominal pain, dysuria, fever, concerns for STD, foul smell from vagina, vaginal lesions, or discolored discharge.   No further skin lesions for SJS. h/o SJS-after rash started was prescribed doxycycline, and another antibiotic ( thought to be macrobid)  HISTORY:  Past Medical History:  Diagnosis Date  . Asthma   . Chicken pox   . Migraine    04/22/08 MRI normal    Past Surgical History:  Procedure Laterality Date  . NO PAST SURGERIES     Family History  Adopted: Yes  Problem Relation Age of Onset  . Cancer Neg Hx   . Diabetes Neg Hx   . Hyperlipidemia Neg Hx   . Hypertension Neg Hx   . Thyroid disease Neg Hx     Allergies: Penicillins Current Outpatient Medications on File Prior to Visit  Medication Sig Dispense Refill  . Calcium Carbonate-Vitamin D 600-400 MG-UNIT tablet Take 1 tablet by mouth daily with breakfast.    . Drospirenone-Ethinyl Estradiol-Levomefol (BEYAZ) 3-0.02-0.451 MG tablet Take 1 tablet by mouth daily.    . [DISCONTINUED] fexofenadine (Rachel ALLERGY) 60 MG tablet Take 1 tablet (60 mg total) by mouth 2 (two) times daily. 120 tablet 1   No current facility-administered medications on file prior to visit.    Social History   Tobacco Use  . Smoking status: Never Smoker  . Smokeless tobacco: Never Used  Vaping Use  . Vaping Use: Former  Substance Use Topics  . Alcohol use: Yes    Alcohol/week: 5.0 standard drinks    Types: 5 Standard drinks or equivalent per week    . Drug use: No    Review of Systems  Constitutional: Negative for chills and fever.  Respiratory: Negative for cough.   Cardiovascular: Negative for chest pain and palpitations.  Gastrointestinal: Negative for nausea and vomiting.  Genitourinary: Negative for dysuria, pelvic pain, urgency, vaginal discharge and vaginal pain.  Skin: Negative for rash.      Objective:    BP 100/72   Pulse 77   Temp 98.2 F (36.8 C)   Ht 5\' 8"  (1.727 m)   Wt 170 lb 3.2 oz (77.2 kg)   SpO2 99%   BMI 25.88 kg/m    Physical Exam Vitals reviewed.  Constitutional:      Appearance: She is well-developed.  Eyes:     Conjunctiva/sclera: Conjunctivae normal.  Cardiovascular:     Rate and Rhythm: Normal rate and regular rhythm.     Pulses: Normal pulses.     Heart sounds: Normal heart sounds.  Pulmonary:     Effort: Pulmonary effort is normal.     Breath sounds: Normal breath sounds. No wheezing, rhonchi or rales.  Genitourinary:    Labia:        Right: No rash or tenderness.        Left: No rash or tenderness.      Vagina: No foreign body. No vaginal discharge, erythema, tenderness or bleeding.  Cervix: No discharge or erythema.     Comments: No erythematous areas or lesions noted externally. Scant discharge from vagina. Vagina is not hyper erythematous.  Skin:    General: Skin is warm and dry.  Neurological:     Mental Status: She is alert.  Psychiatric:        Speech: Speech normal.        Behavior: Behavior normal.        Thought Content: Thought content normal.        Assessment & Plan:   Problem List Items Addressed This Visit      Musculoskeletal and Integument   Vaginal itching - Primary    Some improvement. No overt signs of candida although considering vaginal dryness to be reason for intense pruritus. Opted to wait on vaginal swab today ahead of treatment. May consider around course of diflucan or topical corticosteroid.       Relevant Orders   Cervicovaginal  ancillary only       I have discontinued Rachel Compton's ofloxacin, oxyCODONE, prednisoLONE acetate, predniSONE, and fluconazole. I am also having her maintain her Drospirenone-Ethinyl Estradiol-Levomefol and Calcium Carbonate-Vitamin D.   No orders of the defined types were placed in this encounter.   Return precautions given.   Risks, benefits, and alternatives of the medications and treatment plan prescribed today were discussed, and patient expressed understanding.   Education regarding symptom management and diagnosis given to patient on AVS.  Continue to follow with Rachel Grana, FNP for routine health maintenance.   Rachel Compton and I agreed with plan.   Rachel Plowman, FNP

## 2020-05-12 LAB — CERVICOVAGINAL ANCILLARY ONLY
Bacterial Vaginitis (gardnerella): NEGATIVE
Candida Glabrata: NEGATIVE
Candida Vaginitis: NEGATIVE
Chlamydia: NEGATIVE
Comment: NEGATIVE
Comment: NEGATIVE
Comment: NEGATIVE
Comment: NEGATIVE
Comment: NEGATIVE
Comment: NORMAL
Neisseria Gonorrhea: NEGATIVE
Trichomonas: NEGATIVE

## 2020-06-17 ENCOUNTER — Other Ambulatory Visit: Payer: Self-pay | Admitting: Family

## 2020-06-17 ENCOUNTER — Ambulatory Visit: Payer: BC Managed Care – PPO | Admitting: Nurse Practitioner

## 2020-06-17 ENCOUNTER — Other Ambulatory Visit: Payer: Self-pay

## 2020-06-17 ENCOUNTER — Encounter: Payer: Self-pay | Admitting: Nurse Practitioner

## 2020-06-17 VITALS — BP 101/64 | HR 81 | Temp 97.6°F | Resp 18 | Ht 68.0 in | Wt 174.4 lb

## 2020-06-17 DIAGNOSIS — N939 Abnormal uterine and vaginal bleeding, unspecified: Secondary | ICD-10-CM

## 2020-06-17 DIAGNOSIS — N921 Excessive and frequent menstruation with irregular cycle: Secondary | ICD-10-CM

## 2020-06-17 LAB — IBC PANEL
Iron: 142 ug/dL (ref 42–145)
Saturation Ratios: 36.4 % (ref 20.0–50.0)
Transferrin: 279 mg/dL (ref 212.0–360.0)

## 2020-06-17 LAB — PROTIME-INR
INR: 1.1 ratio — ABNORMAL HIGH (ref 0.8–1.0)
Prothrombin Time: 12.1 s (ref 9.6–13.1)

## 2020-06-17 LAB — APTT: aPTT: 31.4 s (ref 23.4–32.7)

## 2020-06-17 LAB — POCT URINE PREGNANCY: Preg Test, Ur: NEGATIVE

## 2020-06-17 LAB — TSH: TSH: 0.94 u[IU]/mL (ref 0.35–4.50)

## 2020-06-17 NOTE — Progress Notes (Signed)
menorrhea X 5 weeks or 35 days

## 2020-06-17 NOTE — Patient Instructions (Addendum)
Please go to the lab today.   Pelvic ultrasound is ordered.   Continue taking your birth control pill daily.   Please follow up with Claris Che to review results in 1 week.  Get help right away if:  You pass out.  Your bleeding soaks through a pad every hour.  You have abdominal pain.  You have a fever.  You become sweaty or weak.  You pass large blood clots from your vagina. Summary  Abnormal uterine bleeding is unusual bleeding from the uterus.  Any type of abnormal bleeding should be evaluated by a health care provider. Many cases are minor and simple to treat, while others are more serious.  Treatment will depend on the cause of the bleeding.  Abnormal Uterine Bleeding Abnormal uterine bleeding is unusual bleeding from the uterus. It includes:  Bleeding or spotting between periods.  Bleeding after sex.  Bleeding that is heavier than normal.  Periods that last longer than usual.  Bleeding after menopause. Abnormal uterine bleeding can affect women at various stages in life, including teenagers, women in their reproductive years, pregnant women, and women who have reached menopause. Common causes of abnormal uterine bleeding include:  Pregnancy.  Growths of tissue (polyps).  A noncancerous tumor in the uterus (fibroid).  Infection.  Cancer.  Hormonal imbalances. Any type of abnormal bleeding should be evaluated by a health care provider. Many cases are minor and simple to treat, while others are more serious. Treatment will depend on the cause of the bleeding. Follow these instructions at home:  Monitor your condition for any changes.  Do not use tampons, douche, or have sex if told by your health care provider.  Change your pads often.  Get regular exams that include pelvic exams and cervical cancer screening.  Keep all follow-up visits as told by your health care provider. This is important. Contact a health care provider if:  Your bleeding lasts  for more than one week.  You feel dizzy at times.  You feel nauseous or you vomit. Get help right away if:  You pass out.  Your bleeding soaks through a pad every hour.  You have abdominal pain.  You have a fever.  You become sweaty or weak.  You pass large blood clots from your vagina. Summary  Abnormal uterine bleeding is unusual bleeding from the uterus.  Any type of abnormal bleeding should be evaluated by a health care provider. Many cases are minor and simple to treat, while others are more serious.  Treatment will depend on the cause of the bleeding. This information is not intended to replace advice given to you by your health care provider. Make sure you discuss any questions you have with your health care provider. Document Revised: 10/25/2017 Document Reviewed: 08/19/2016 Elsevier Patient Education  2020 [employer].

## 2020-06-18 ENCOUNTER — Encounter: Payer: Self-pay | Admitting: Nurse Practitioner

## 2020-06-18 NOTE — Progress Notes (Signed)
Established Patient Office Visit  Subjective:  Patient ID: Rachel Compton, female    DOB: 04-May-1991  Age: 29 y.o. MRN: [id number]  CC:  Chief Complaint  Patient presents with  . Menstrual Problem    Since 2 week in October    HPI Rachel Compton is a 29 year old patient with history of allergic rhinitis, large thyroid, Stevens-Johnson syndrome resolved, who presents with breakthrough vaginal bleeding since the second week of October.  She has been no her usual Beyaz oral birth control all of last year following DEPO and IUD previously. She does not have a personal or FH of clotting disorder. She is sexually active.  She was seen 05/11/2020 for vaginal  itching and had an unremarkable pelvic exam and negative cervical vaginal swab.  She recalls that she had Stevens-Johnson syndrome in August, and she was off the birth control pill for 3 to 4 weeks during that  time.  She started back on her Marygrace Drought in September and had a  menstrual cycle for about 5 days at the end of the month . Following this,  2 weeks later,  she started spotting and has used a tampon every day since. She may have no blood on her tampon 1 day and then heavier the next.  Over the last week, she has had heavier bleeding, saw tiny clots a few days, and has had mild , intermittent pelvic crampiness. She missed her birth control pill last weekend for 2 days and took a Plan B on Sunday.  She noted no heavier bleeding or cramping following this medication. She is changing her tampon x2 per day.  No purulent vaginal discharge.  No pelvic pain, back pain, fevers or chills.    She chose the [place]  birth control method because she did not want to have menstrual cycles.  She did not have menstrual cycles last year and wants these to stop.   Point-of-care pregnancy today is negative.  Past Medical History:  Diagnosis Date  . Asthma   . Chicken pox   . Migraine    04/22/08 MRI normal     Past Surgical History:    Procedure Laterality Date  . NO PAST SURGERIES      Family History  Adopted: Yes  Problem Relation Age of Onset  . Cancer Neg Hx   . Diabetes Neg Hx   . Hyperlipidemia Neg Hx   . Hypertension Neg Hx   . Thyroid disease Neg Hx     Social History   Socioeconomic History  . Marital status: Married    Spouse name: Not on file  . Number of children: Not on file  . Years of education: Not on file  . Highest education level: Not on file  Occupational History  . Not on file  Tobacco Use  . Smoking status: Never Smoker  . Smokeless tobacco: Never Used  Vaping Use  . Vaping Use: Former  Substance and Sexual Activity  . Alcohol use: Yes    Alcohol/week: 5.0 standard drinks    Types: 5 Standard drinks or equivalent per week  . Drug use: No  . Sexual activity: Yes    Partners: Male    Birth control/protection: Pill  Other Topics Concern  . Not on file  Social History Narrative   Adopted - no family history   Teacher, English as a foreign language   Social Determinants of Health   Financial Resource Strain:   . Difficulty of Paying Living  Expenses: Not on file  Food Insecurity:   . Worried About Programme researcher, broadcasting/film/video in the Last Year: Not on file  . Ran Out of Food in the Last Year: Not on file  Transportation Needs:   . Lack of Transportation (Medical): Not on file  . Lack of Transportation (Non-Medical): Not on file  Physical Activity:   . Days of Exercise per Week: Not on file  . Minutes of Exercise per Session: Not on file  Stress:   . Feeling of Stress : Not on file  Social Connections:   . Frequency of Communication with Friends and Family: Not on file  . Frequency of Social Gatherings with Friends and Family: Not on file  . Attends Religious Services: Not on file  . Active Member of Clubs or Organizations: Not on file  . Attends Banker Meetings: Not on file  . Marital Status: Not on file  Intimate Partner Violence:   . Fear of Current or Ex-Partner:  Not on file  . Emotionally Abused: Not on file  . Physically Abused: Not on file  . Sexually Abused: Not on file    Outpatient Medications Prior to Visit  Medication Sig Dispense Refill  . Calcium Carbonate-Vitamin D 600-400 MG-UNIT tablet Take 1 tablet by mouth daily with breakfast.    . Drospirenone-Ethinyl Estradiol-Levomefol (BEYAZ) 3-0.02-0.451 MG tablet Take 1 tablet by mouth daily.     No facility-administered medications prior to visit.    Allergies  Allergen Reactions  . Penicillins Hives    Review of Systems  Constitutional: Negative for chills and fever.  Respiratory: Negative.   Cardiovascular: Negative.   Gastrointestinal: Negative for abdominal pain, nausea and vomiting.  Genitourinary: Positive for menstrual problem, pelvic pain and vaginal bleeding. Negative for dysuria and vaginal pain.  Musculoskeletal: Negative.   Skin: Negative.   Neurological: Negative.       Objective:    Physical Exam Vitals reviewed.  Constitutional:      Appearance: Normal appearance. She is normal weight.  Cardiovascular:     Rate and Rhythm: Normal rate and regular rhythm.     Pulses: Normal pulses.     Heart sounds: Normal heart sounds.  Pulmonary:     Effort: Pulmonary effort is normal.     Breath sounds: Normal breath sounds.  Abdominal:     Palpations: Abdomen is soft.     Tenderness: There is no abdominal tenderness. There is no guarding.  Genitourinary:    Comments: Deferred as she had a pelvic exam last month.  Musculoskeletal:        General: Normal range of motion.     Cervical back: Normal range of motion and neck supple.  Skin:    General: Skin is warm and dry.  Neurological:     General: No focal deficit present.     Mental Status: She is alert and oriented to person, place, and time.  Psychiatric:        Mood and Affect: Mood normal.     BP 101/64 (BP Location: Left Arm, Patient Position: Sitting, Cuff Size: Normal)   Pulse 81   Temp 97.6 F (36.4  C) (Oral)   Resp 18   Ht 5\' 8"  (1.727 m)   Wt 174 lb 6 oz (79.1 kg)   SpO2 99%   BMI 26.51 kg/m  Wt Readings from Last 3 Encounters:  06/17/20 174 lb 6 oz (79.1 kg)  05/11/20 170 lb 3.2 oz (77.2 kg)  03/25/20 166  lb 3.2 oz (75.4 kg)   Pulse Readings from Last 3 Encounters:  06/17/20 81  05/11/20 77  03/25/20 97    BP Readings from Last 3 Encounters:  06/17/20 101/64  05/11/20 100/72  03/25/20 98/70    Lab Results  Component Value Date   CHOL 162 10/20/2017   HDL 60.80 10/20/2017   LDLCALC 93 10/20/2017   TRIG 40.0 10/20/2017   CHOLHDL 3 10/20/2017      Health Maintenance Due  Topic Date Due  . Hepatitis C Screening  Never done  . COVID-19 Vaccine (2 - Pfizer 2-dose series) 05/12/2020    There are no preventive care reminders to display for this patient.  Lab Results  Component Value Date   TSH 0.94 06/17/2020   Lab Results  Component Value Date   WBC 6.2 06/17/2020   HGB 13.3 06/17/2020   HCT 37.8 06/17/2020   MCV 88 06/17/2020   PLT 303.0 04/30/2020   Lab Results  Component Value Date   NA 139 03/25/2020   K 3.6 03/25/2020   CO2 30 03/25/2020   GLUCOSE 116 (H) 03/25/2020   BUN 10 03/25/2020   CREATININE 0.88 03/25/2020   BILITOT 0.4 03/25/2020   ALKPHOS 56 03/25/2020   AST 14 03/25/2020   ALT 34 03/25/2020   PROT 6.9 03/25/2020   ALBUMIN 3.9 03/25/2020   CALCIUM 9.5 03/25/2020   ANIONGAP 9 03/14/2020   GFR 74.91 03/25/2020   Lab Results  Component Value Date   CHOL 162 10/20/2017   Lab Results  Component Value Date   HDL 60.80 10/20/2017   Lab Results  Component Value Date   LDLCALC 93 10/20/2017   Lab Results  Component Value Date   TRIG 40.0 10/20/2017   Lab Results  Component Value Date   CHOLHDL 3 10/20/2017   Lab Results  Component Value Date   HGBA1C 5.3 10/20/2017      Assessment & Plan:   Problem List Items Addressed This Visit      Other   Abnormal vaginal bleeding - Primary   Relevant Orders   POCT  urine pregnancy (Completed)    Other Visit Diagnoses    Menorrhagia with irregular cycle         Patient had interruption in her oral birth control pill for 4 weeks in September following her hospitalization at [facility] for Stevens-Johnson syndrome.  She started back on the pill pack and had a menstrual cycle the end of the month in September that lasted 4-5 days. Then, she bled again  2 weeks or so later, and this has continued since the second week in October.  She did have a pelvic exam October 11 for vaginal discharge with negative cultures.  Unremarkable pelvic exam.  No history of uterine fibroids, clotting disorders, or dysmenorrhea.  She had not have a menstrual cycle all last year.  She may be shedding endometrial lining.  Her point-of-care pregnancy today  is negative.  She did have missed birth control pill x 2 days over the weekend, took Plan B pill.  We will also check beta hCG. I reviewed this case with Claris Che who  saw her last month.  We will check blood work to look for clotting disorder, thyroid, and order a pelvic US in order to make further recommendation regarding birth control options.  She will follow-up with Claris Che in one week to review laboratory and pelvic US findings.   Patient advised: Please go to the lab today.   Pelvic  ultrasound is ordered.   Continue taking your birth control pill daily.   Please follow up with Margaret to review results in 1 week.  Get hClaris Cheelp right away if:  You pass out.  Your bleeding soaks through a pad every hour.  You have abdominal pain.  You have a fever.  You become sweaty or weak.  You pass large blood clots from your vagina. Summary  Abnormal uterine bleeding is unusual bleeding from the uterus.  Any type of abnormal bleeding should be evaluated by a health care provider. Many cases are minor and simple to treat, while others are more serious.  Treatment will depend on the cause of the bleeding.  Follow-up: Return in about  1 week (around 06/24/2020), [facility]   This visit occurred during the SARS-CoV-2 public health emergency.  Safety protocols were in place, including screening questions prior to the visit, additional usage of staff PPE, and extensive cleaning of exam room while observing appropriate contact time as indicated for disinfecting solutions.    Amedeo KinsmanKimberly Bradie Sangiovanni, NP

## 2020-06-22 LAB — CBC WITH DIFFERENTIAL
Basophils Absolute: 0.1 10*3/uL (ref 0.0–0.2)
Basos: 1 %
EOS (ABSOLUTE): 0.5 10*3/uL — ABNORMAL HIGH (ref 0.0–0.4)
Eos: 9 %
Hematocrit: 37.8 % (ref 34.0–46.6)
Hemoglobin: 13.3 g/dL (ref 11.1–15.9)
Immature Grans (Abs): 0 10*3/uL (ref 0.0–0.1)
Immature Granulocytes: 1 %
Lymphocytes Absolute: 2.1 10*3/uL (ref 0.7–3.1)
Lymphs: 34 %
MCH: 31.1 pg (ref 26.6–33.0)
MCHC: 35.2 g/dL (ref 31.5–35.7)
MCV: 88 fL (ref 79–97)
Monocytes Absolute: 0.3 10*3/uL (ref 0.1–0.9)
Monocytes: 5 %
Neutrophils Absolute: 3.2 10*3/uL (ref 1.4–7.0)
Neutrophils: 50 %
RBC: 4.28 x10E6/uL (ref 3.77–5.28)
RDW: 12.5 % (ref 11.7–15.4)
WBC: 6.2 10*3/uL (ref 3.4–10.8)

## 2020-06-22 LAB — ANTIPHOSPHOLIPID SYNDROME EVAL, BLD
APTT PPP: 25.8 s (ref 22.9–30.2)
Anticardiolipin IgG: <9 GPL U/mL (ref 0–14)
Anticardiolipin IgM: <9 MPL U/mL (ref 0–12)
Beta-2 Glyco 1 IgM: <9 GPI IgM units (ref 0–32)
Beta-2 Glyco I IgG: <9 GPI IgG units (ref 0–20)
Dilute Viper Venom Time: 39.4 s (ref 0.0–47.0)
Hexagonal Phase Phospholipid: 3 s (ref 0–11)
INR: 1 (ref 0.9–1.2)
PT: 10.3 s (ref 9.1–12.0)
Thrombin Time: 17.1 s (ref 0.0–23.0)

## 2020-06-22 LAB — ANTITHROMBIN PANEL
AT III AG PPP IMM-ACNC: 82 % (ref 72–124)
AntiThromb III Func: 104 % (ref 75–135)

## 2020-06-22 LAB — PT AND PTT
INR: 0.9 (ref 0.9–1.2)
Prothrombin Time: 9.6 s (ref 9.1–12.0)
aPTT: 28 s (ref 24–33)

## 2020-06-22 LAB — COAG STUDIES INTERP REPORT

## 2020-06-23 ENCOUNTER — Telehealth: Payer: Self-pay | Admitting: Family

## 2020-06-23 DIAGNOSIS — N939 Abnormal uterine and vaginal bleeding, unspecified: Secondary | ICD-10-CM

## 2020-06-23 NOTE — Telephone Encounter (Signed)
Call pt For some reason quest failed to result a couple of her labs , please sch lab in next 1-2 weeks

## 2020-06-23 NOTE — Telephone Encounter (Signed)
LMTCB

## 2020-06-24 ENCOUNTER — Ambulatory Visit: Payer: BC Managed Care – PPO

## 2020-06-24 LAB — FACTOR 8 RISTOCETIN COFACTOR

## 2020-06-24 LAB — HCG, SERUM, QUALITATIVE: Preg, Serum: NEGATIVE

## 2020-06-24 LAB — BETA-2 GLYCOPROTEIN ANTIBODIES
Beta-2 Glyco 1 IgA: <2 U/mL
Beta-2 Glyco 1 IgM: <2 U/mL
Beta-2 Glyco I IgG: <2 U/mL

## 2020-06-24 LAB — PROTEIN S ACTIVITY: Protein S Activity: 67 % (ref 60–140)

## 2020-06-24 LAB — FACTOR 5 LEIDEN: Result: NEGATIVE

## 2020-06-24 LAB — CARDIOLIPIN ANTIBODIES, IGG, IGM, IGA
Anticardiolipin IgA: <2 APL-U/mL
Anticardiolipin IgG: <2 GPL-U/mL
Anticardiolipin IgM: <2 MPL-U/mL

## 2020-06-24 LAB — APTT

## 2020-06-24 LAB — PROTHROMBIN GENE MUTATION: PROTHROMBIN (FACTOR II): NEGATIVE

## 2020-06-24 LAB — PROTEIN C, TOTAL: PROTEIN C, ANTIGEN: 89 % (ref 70–140)

## 2020-06-24 LAB — FACTOR 8 ASSAY

## 2020-06-24 LAB — PROTEIN C ACTIVITY: Protein C Activity: 116 % (ref 70–180)

## 2020-06-24 LAB — PROTEIN S, TOTAL: PROTEIN S ANTIGEN, TOTAL: 61 % normal — ABNORMAL LOW (ref 70–140)

## 2020-07-02 ENCOUNTER — Other Ambulatory Visit: Payer: Self-pay

## 2020-07-02 ENCOUNTER — Ambulatory Visit
Admission: RE | Admit: 2020-07-02 | Discharge: 2020-07-02 | Disposition: A | Discharge status: Home / Self Care | Payer: BC Managed Care – PPO | Source: Ambulatory Visit | Attending: Family | Admitting: Family

## 2020-07-02 DIAGNOSIS — N921 Excessive and frequent menstruation with irregular cycle: Secondary | ICD-10-CM | POA: Insufficient documentation

## 2020-07-02 NOTE — Telephone Encounter (Signed)
Pt comes in on 1/3 & would prefer to just have drawn then if okay? She said that she was very busy this month.

## 2020-07-03 NOTE — Telephone Encounter (Signed)
Yes that is fine

## 2020-07-05 ENCOUNTER — Encounter: Payer: Self-pay | Admitting: Family

## 2020-07-06 ENCOUNTER — Other Ambulatory Visit: Payer: Self-pay | Admitting: Family

## 2020-07-06 DIAGNOSIS — N939 Abnormal uterine and vaginal bleeding, unspecified: Secondary | ICD-10-CM

## 2020-07-06 DIAGNOSIS — N898 Other specified noninflammatory disorders of vagina: Secondary | ICD-10-CM

## 2020-07-08 ENCOUNTER — Other Ambulatory Visit: Payer: Self-pay

## 2020-07-08 ENCOUNTER — Ambulatory Visit: Payer: BC Managed Care – PPO | Admitting: Obstetrics and Gynecology

## 2020-07-08 ENCOUNTER — Encounter: Payer: Self-pay | Admitting: Obstetrics and Gynecology

## 2020-07-08 VITALS — BP 118/84 | HR 139 | Ht 68.0 in | Wt 181.2 lb

## 2020-07-08 DIAGNOSIS — N921 Excessive and frequent menstruation with irregular cycle: Secondary | ICD-10-CM | POA: Diagnosis not present

## 2020-07-08 MED ORDER — MEDROXYPROGESTERONE ACETATE 5 MG PO TABS: 5.0000 mg | ORAL_TABLET | Freq: Every day | ORAL | 0 refills | Status: DC

## 2020-07-08 MED ORDER — LEVONORGEST-ETH ESTRAD 91-DAY 0.15-0.03 &0.01 MG PO TABS: 1.0000 | ORAL_TABLET | Freq: Every day | ORAL | 1 refills | Status: DC

## 2020-07-08 NOTE — Progress Notes (Signed)
HPI:      [name] is a 29 y.o. [id number] who LMP was Patient's last menstrual period was 05/18/2020.  Subjective:   She presents today because she has had continued breakthrough bleeding on OCPs.  She was initially on them without any difficulty for several months and had normal regular cycles.  She was skipping the last week of each pack and having 3 months of amenorrhea.  In August patient was hospitalized for Stevens-Johnson syndrome.  She did not take pills during the month. She restarted the pills, had a first normal.,  And then from October till now she has been having breakthrough bleeding off and on despite taking the pills correctly.  She has had a work-up for blood dyscrasia and an ultrasound which she reports as "normal". She would like birth control and she would like the bleeding to stop. She has previously tried IUD and was not happy with the way it felt.  She is used Depo in the past.    Hx: The following portions of the patient's history were reviewed and updated as appropriate:             She  has a past medical history of Asthma, Chicken pox, and Migraine. She does not have any pertinent problems on file. She  has a past surgical history that includes No past surgeries and Eye surgery (03/2020). Her family history is not on file. She was adopted. She  reports that she has never smoked. She has never used smokeless tobacco. She reports current alcohol use of about 5.0 standard drinks of alcohol per week. She reports that she does not use drugs. She has a current medication list which includes the following prescription(s): calcium carbonate-vitamin d, drospirenone-ethinyl estradiol-levomefol, levonorgestrel-ethinyl estradiol, medroxyprogesterone, and [DISCONTINUED] fexofenadine. She is allergic to penicillins.       Review of Systems:  Review of Systems  Constitutional: Denied constitutional symptoms, night sweats, recent illness, fatigue, fever, insomnia and  weight loss.  Eyes: Denied eye symptoms, eye pain, photophobia, vision change and visual disturbance.  Ears/Nose/Throat/Neck: Denied ear, nose, throat or neck symptoms, hearing loss, nasal discharge, sinus congestion and sore throat.  Cardiovascular: Denied cardiovascular symptoms, arrhythmia, chest pain/pressure, edema, exercise intolerance, orthopnea and palpitations.  Respiratory: Denied pulmonary symptoms, asthma, pleuritic pain, productive sputum, cough, dyspnea and wheezing.  Gastrointestinal: Denied, gastro-esophageal reflux, melena, nausea and vomiting.  Genitourinary: See HPI for additional information.  Musculoskeletal: Denied musculoskeletal symptoms, stiffness, swelling, muscle weakness and myalgia.  Dermatologic: Denied dermatology symptoms, rash and scar.  Neurologic: Denied neurology symptoms, dizziness, headache, neck pain and syncope.  Psychiatric: Denied psychiatric symptoms, anxiety and depression.  Endocrine: Denied endocrine symptoms including hot flashes and night sweats.   Meds:   Current Outpatient Medications on File Prior to Visit  Medication Sig Dispense Refill  . Calcium Carbonate-Vitamin D 600-400 MG-UNIT tablet Take 1 tablet by mouth daily with breakfast.    . Drospirenone-Ethinyl Estradiol-Levomefol (BEYAZ) 3-0.02-0.451 MG tablet Take 1 tablet by mouth daily.    . [DISCONTINUED] fexofenadine (ALLEGRA ALLERGY) 60 MG tablet Take 1 tablet (60 mg total) by mouth 2 (two) times daily. 120 tablet 1   No current facility-administered medications on file prior to visit.       The pregnancy intention screening data noted above was reviewed. Potential methods of contraception were discussed. The patient elected to proceed with Oral Contraceptive.     Objective:     Vitals:   07/08/20 1435  BP: 118/84  Pulse: Marland Kitchen)  139   Filed Weights   07/08/20 1435  Weight: 181 lb 3.2 oz (82.2 kg)              Physical examination   Pelvic:   Vulva: Normal appearance.   No lesions.  Vagina: No lesions or abnormalities noted.  Support: Normal pelvic support.  Urethra No masses tenderness or scarring.  Meatus Normal size without lesions or prolapse.  Cervix: Normal appearance.  No lesions.  Anus: Normal exam.  No lesions.  Perineum: Normal exam.  No lesions.     Assessment:    [id number] Patient Active Problem List   Diagnosis Date Noted  . Abnormal vaginal bleeding 06/17/2020  . Vaginal itching 05/11/2020  . Migraines 03/25/2020  . Membranous conjunctivitis 03/22/2020  . Blistering rash 03/15/2020  . Stevens-Johnson syndrome (HCC) 03/12/2020  . Breast lump in upper outer quadrant 09/27/2019  . Acute otalgia, right 08/27/2019  . Cerumen impaction 08/27/2019  . Constipation 02/22/2019  . Lymphadenopathy 04/16/2018  . Other fatigue 02/09/2018  . Enlarged thyroid 02/09/2018  . Right ankle pain 11/15/2017  . Allergic rhinitis 11/15/2017  . Boil 10/04/2017  . Acute nonintractable headache 02/23/2017  . Bloating 02/23/2017  . Annual physical exam 06/09/2016  . Active labor at term 04/18/2013  . Other specified antenatal screening(V28.89) 03/12/2013  . Anemia 02/12/2013  . Polydactyly, fetal, affecting care of mother, antepartum 01/11/2013     1. Breakthrough bleeding on birth control pills     No pelvic abnormalities on speculum exam.  No abnormalities noted on ultrasound. She is currently taking an OCP with poor cycle control.   Plan:            1.  Patient would like to stay on OCPs if possible.  She would like to have menstrual periods every 49-month if possible.  2.  For immediate relief of bleeding will combine 2 weeks of low-dose progesterone with OCPs.  Recommend change in OCPs to [facility] for better cycle control and to give her the 60-month apart menses. Patient to contact us if breakthrough bleeding continues despite above regimen. Orders No orders of the defined types were placed in this encounter.    Meds ordered this  encounter  Medications  . medroxyPROGESTERone (PROVERA) 5 MG tablet    Sig: Take 1 tablet (5 mg total) by mouth daily for 14 days.    Dispense:  14 tablet    Refill:  0  . Levonorgestrel-Ethinyl Estradiol (AMETHIA) 0.15-0.03 &0.01 MG tablet    Sig: Take 1 tablet by mouth at bedtime.    Dispense:  84 tablet    Refill:  1      F/U  Return in about 6 months (around 01/06/2021) for Pt to contact us if symptoms worsen. I spent 34 minutes involved in the care of this patient preparing to see the patient by obtaining and reviewing her medical history (including labs, imaging tests and prior procedures), documenting clinical information in the electronic health record (EHR), counseling and coordinating care plans, writing and sending prescriptions, ordering tests or procedures and directly communicating with the patient by discussing pertinent items from her history and physical exam as well as detailing my assessment and plan as noted above so that she has an informed understanding.  All of her questions were answered.  Elonda Husky, M.D. 07/08/2020 3:34 PM

## 2020-08-03 ENCOUNTER — Telehealth: Payer: BC Managed Care – PPO | Admitting: Family

## 2020-08-05 ENCOUNTER — Encounter: Payer: Self-pay | Admitting: Family Medicine

## 2020-08-05 ENCOUNTER — Other Ambulatory Visit: Payer: Self-pay

## 2020-08-05 ENCOUNTER — Ambulatory Visit: Payer: BC Managed Care – PPO | Admitting: Family Medicine

## 2020-08-05 VITALS — BP 110/70 | HR 84 | Temp 98.3°F | Ht 68.0 in | Wt 187.0 lb

## 2020-08-05 DIAGNOSIS — N3000 Acute cystitis without hematuria: Secondary | ICD-10-CM | POA: Diagnosis not present

## 2020-08-05 DIAGNOSIS — N390 Urinary tract infection, site not specified: Secondary | ICD-10-CM | POA: Insufficient documentation

## 2020-08-05 DIAGNOSIS — R300 Dysuria: Secondary | ICD-10-CM | POA: Diagnosis not present

## 2020-08-05 LAB — URINALYSIS, MICROSCOPIC ONLY

## 2020-08-05 LAB — POCT URINALYSIS DIPSTICK
Blood, UA: NEGATIVE
Glucose, UA: NEGATIVE
Nitrite, UA: POSITIVE
Protein, UA: POSITIVE — AB
Spec Grav, UA: 1.025 (ref 1.010–1.025)
Urobilinogen, UA: 2 E.U./dL — AB
pH, UA: 6 (ref 5.0–8.0)

## 2020-08-05 MED ORDER — NITROFURANTOIN MONOHYD MACRO 100 MG PO CAPS: 100.0000 mg | ORAL_CAPSULE | Freq: Two times a day (BID) | ORAL | 0 refills | Status: DC

## 2020-08-05 NOTE — Progress Notes (Signed)
  Marikay Alar, MD Phone: [phone]  Rachel Compton is a 30 y.o. female who presents today for same-day visit.  UTI: Dysuria- yes Frequency- yes  Urgency- yes  Hematuria- no  Abd pain- no  Vaginal d/c- no Onset on 08/04/2019.  Of note patient did have Stevens-Johnson syndrome with and unknown precipitating event in August 2021.  Symptom onset of that occurred when she was in [place].  She was misdiagnosed with a UTI and treated with Macrobid at that time.  The patient did take Azo yesterday.   Social History   Tobacco Use  Smoking Status Never Smoker  Smokeless Tobacco Never Used    Current Outpatient Medications on File Prior to Visit  Medication Sig Dispense Refill  . LORYNA 3-0.02 MG tablet Take by mouth.    . [DISCONTINUED] fexofenadine (ALLEGRA ALLERGY) 60 MG tablet Take 1 tablet (60 mg total) by mouth 2 (two) times daily. 120 tablet 1   No current facility-administered medications on file prior to visit.     ROS see history of present illness  Objective  Physical Exam Vitals:   08/05/20 1427  BP: 110/70  Pulse: 84  Temp: 98.3 F (36.8 C)  SpO2: 96%    BP Readings from Last 3 Encounters:  08/05/20 110/70  07/08/20 118/84  06/17/20 101/64   Wt Readings from Last 3 Encounters:  08/05/20 187 lb (84.8 kg)  07/08/20 181 lb 3.2 oz (82.2 kg)  06/17/20 174 lb 6 oz (79.1 kg)    Physical Exam Constitutional:      General: She is not in acute distress. Abdominal:     General: Bowel sounds are normal. There is no distension.     Palpations: Abdomen is soft.     Tenderness: There is no abdominal tenderness. There is no guarding or rebound.  Neurological:     Mental Status: She is alert.      Assessment/Plan: Please see individual problem list.  Problem List Items Addressed This Visit    UTI (urinary tract infection)    Symptoms are concerning for UTI.  Urinalysis is also concerning though she did take Azo which can alter this result.  We will  send urine for culture and microscopy.  We will treat with Macrobid.  I discussed monitoring for any recurrent Stevens-Johnson symptoms after starting this medication and letting us know immediately if they occur.  She would also immediately discontinue the Macrobid if those symptoms recur.  Her prior Stevens-Johnson syndromes started prior to her starting on Macrobid when she was in [place].      Relevant Medications   nitrofurantoin, macrocrystal-monohydrate, (MACROBID) 100 MG capsule    Other Visit Diagnoses    Dysuria    -  Primary   Relevant Orders   POCT Urinalysis Dipstick (Completed)   Urine Microscopic Only   Urine Culture      This visit occurred during the SARS-CoV-2 public health emergency.  Safety protocols were in place, including screening questions prior to the visit, additional usage of staff PPE, and extensive cleaning of exam room while observing appropriate contact time as indicated for disinfecting solutions.    Marikay Alar, MD [facility]

## 2020-08-05 NOTE — Assessment & Plan Note (Signed)
Symptoms are concerning for UTI.  Urinalysis is also concerning though she did take Azo which can alter this result.  We will send urine for culture and microscopy.  We will treat with Macrobid.  I discussed monitoring for any recurrent Stevens-Johnson symptoms after starting this medication and letting us know immediately if they occur.  She would also immediately discontinue the Macrobid if those symptoms recur.  Her prior Stevens-Johnson syndromes started prior to her starting on Macrobid when she was in [place].

## 2020-08-05 NOTE — Patient Instructions (Signed)
Nice to see you. We will treat you for a UTI with Macrobid. We will contact you with your urine culture results. If you develop any rash or side effects while starting on this medication please discontinue it and let us know immediately.

## 2020-08-07 LAB — URINE CULTURE
MICRO NUMBER:: 11385047
SPECIMEN QUALITY:: ADEQUATE

## 2020-09-09 ENCOUNTER — Telehealth: Payer: Self-pay | Admitting: Family

## 2020-09-10 NOTE — Telephone Encounter (Signed)
close

## 2020-12-17 ENCOUNTER — Other Ambulatory Visit: Payer: Self-pay | Admitting: Obstetrics and Gynecology

## 2020-12-17 DIAGNOSIS — N921 Excessive and frequent menstruation with irregular cycle: Secondary | ICD-10-CM

## 2020-12-18 ENCOUNTER — Other Ambulatory Visit: Payer: Self-pay

## 2020-12-18 ENCOUNTER — Telehealth: Payer: Self-pay | Admitting: Family

## 2020-12-18 ENCOUNTER — Other Ambulatory Visit: Payer: Self-pay | Admitting: Obstetrics and Gynecology

## 2020-12-18 DIAGNOSIS — N921 Excessive and frequent menstruation with irregular cycle: Secondary | ICD-10-CM

## 2020-12-18 MED ORDER — LORYNA 3-0.02 MG PO TABS: 1.0000 | ORAL_TABLET | Freq: Every day | ORAL | 1 refills | Status: DC

## 2020-12-18 NOTE — Telephone Encounter (Signed)
noted 

## 2020-12-18 NOTE — Telephone Encounter (Signed)
Patient is scheduled on 12/21/20 at 8am/virutal. Patient has allergies and also needs her birth control refilled. No appointments are available with her provider.

## 2020-12-21 ENCOUNTER — Telehealth: Payer: Self-pay

## 2020-12-21 ENCOUNTER — Telehealth: Payer: BC Managed Care – PPO | Admitting: Adult Health

## 2020-12-21 NOTE — Telephone Encounter (Signed)
Called Rachel Compton for her appt and she didn't answer . Left message to call back

## 2020-12-21 NOTE — Telephone Encounter (Signed)
Called Eliyah for a second time and she didn't answer. Left message to call back.

## 2021-01-15 ENCOUNTER — Other Ambulatory Visit: Payer: Self-pay

## 2021-01-15 ENCOUNTER — Encounter: Payer: Self-pay | Admitting: Family

## 2021-01-15 ENCOUNTER — Ambulatory Visit: Payer: BC Managed Care – PPO | Admitting: Family

## 2021-01-15 VITALS — BP 98/68 | HR 63 | Temp 98.5°F | Ht 68.0 in | Wt 183.4 lb

## 2021-01-15 DIAGNOSIS — M25561 Pain in right knee: Secondary | ICD-10-CM | POA: Insufficient documentation

## 2021-01-15 MED ORDER — MELOXICAM 7.5 MG PO TABS: 7.5000 mg | ORAL_TABLET | Freq: Every day | ORAL | 1 refills | Status: DC | PRN

## 2021-01-15 MED ORDER — PREDNISONE 10 MG PO TABS
ORAL_TABLET | ORAL | 0 refills | Status: DC
Start: 2021-01-15 — End: 2021-03-30

## 2021-01-15 NOTE — Assessment & Plan Note (Addendum)
Symptoms consistent with patellofemoral syndrome. Conservative therapy with prednisone course, prn mobic, icing. She declines imaging today of knee.  Exercises provided. Close follow up.

## 2021-01-15 NOTE — Patient Instructions (Signed)
Patellofemoral Pain Syndrome Patellofemoral pain syndrome is a condition in which the tissue (cartilage) on the underside of the kneecap (patella) softens or breaks down. This causes pain in the front of the knee. The condition is also called runner's knee or chondromalacia patella. Patellofemoral pain syndrome is most common in young adults who are active in sports. The knee is the largest joint in the body. The patella covers the front of the knee and is attached to muscles above and below the knee. The underside of the patella is covered with a smooth type of cartilage (synovium). The smooth surface helps the patella glide easily when you move your knee. Patellofemoral pain syndrome causes swelling in the joint linings and bone surfaces in the knee. What are the causes? This condition may be caused by: Overuse of the knee. Poor alignment of your knee joints. Weak leg muscles. A direct hit to your kneecap. What increases the risk? You are more likely to develop this condition if: You do a lot of activities that can wear down your kneecap. These include: Running. Squatting. Climbing stairs. You start a new physical activity or exercise program. You wear shoes that do not fit well. You do not have good leg strength. You are overweight. What are the signs or symptoms? The main symptom of this condition is knee pain. This may feel like a dull, aching pain underneath your patella, in the front of your knee. There may be a popping or cracking sound when you move your knee. Pain may get worse with: Exercise. Climbing stairs. Running. Jumping. Squatting. Kneeling. Sitting for a long time. Moving or pushing on your patella. How is this diagnosed? This condition may be diagnosed based on: Your symptoms and medical history. You may be asked about your recent physical activities and which ones cause knee pain. A physical exam. This may include: Moving your patella back and forth. Checking  your range of knee motion. Having you squat or jump to see if you have pain. Checking the strength of your leg muscles. Imaging tests to confirm the diagnosis. These may include an MRI of your knee. How is this treated? This condition may be treated at home with rest, ice, compression, and elevation (RICE).  Other treatments may include: NSAIDs, such as ibuprofen. Physical therapy to stretch and strengthen your leg muscles. Shoe inserts (orthotics) to take stress off your knee. A knee brace or knee support. Adhesive tapes to the skin. Surgery to remove damaged cartilage or move the patella to a better position. This is rare. Follow these instructions at home: If you have a brace: Wear the brace as told by your health care provider. Remove it only as told by your health care provider. Loosen the brace if your toes tingle, become numb, or turn cold and blue. Keep the brace clean. If the brace is not waterproof: Do not let it get wet. Cover it with a watertight covering when you take a bath or a shower. Managing pain, stiffness, and swelling  If directed, put ice on the painful area. To do this: If you have a removable brace, remove it as told by your health care provider. Put ice in a plastic bag. Place a towel between your skin and the bag. Leave the ice on for 20 minutes, 2-3 times a day. Remove the ice if your skin turns bright red. This is very important. If you cannot feel pain, heat, or cold, you have a greater risk of damage to the area. Move   your toes often to reduce stiffness and swelling. Raise (elevate) the injured area above the level of your heart while you are sitting or lying down. Activity Rest your knee. Avoid activities that cause knee pain. Perform stretching and strengthening exercises as told by your health care provider or physical therapist. Return to your normal activities as told by your health care provider. Ask your health care provider what activities are  safe for you. General instructions Take over-the-counter and prescription medicines only as told by your health care provider. Use splints, braces, knee supports, or walking aids as directed by your health care provider. Do not use any products that contain nicotine or tobacco, such as cigarettes, e-cigarettes, and chewing tobacco. These can delay healing. If you need help quitting, ask your health care provider. Keep all follow-up visits. This is important. Contact a health care provider if: Your symptoms get worse. You are not improving with home care. Summary Patellofemoral pain syndrome is a condition in which the tissue (cartilage) on the underside of the kneecap (patella) softens or breaks down. This condition causes swelling in the joint linings and bone surfaces in the knee. This leads to pain in the front of the knee. This condition may be treated at home with rest, ice, compression, and elevation (RICE). Use splints, braces, knee supports, or walking aids as directed by your health care provider. This information is not intended to replace advice given to you by your health care provider. Make sure you discuss any questions you have with your health care provider. Document Revised: 01/01/2020 Document Reviewed: 01/01/2020 Elsevier Patient Education  2022 [employer]  

## 2021-01-15 NOTE — Progress Notes (Signed)
Subjective:    Patient ID: Rachel Compton, female    DOB: Sep 27, 1990, 30 y.o.   MRN: [id number]  CC: Margaruite Top is a 30 y.o. female who presents today for an acute visit.    HPI: Right anterior knee pain, constant for 4 weeks, waxes and wanes.  No fever, rash, swelling.  Pain worsens extension. Knee hasnt given way. No pain with stairs  Wearing neoprene sleeve. Temporary relief with tylenol and ibuprofen with temporary improvement.   No known injury Siting mostly at work.  Had been walking for exercise prior to pain. She rides an Mining engineer bike.   Injury 10 years ago when fell on right knee. No fracture, surgery. Used crutches at that time and knee pain resolved.   No h/o gib     HISTORY:  Past Medical History:  Diagnosis Date   Asthma    Chicken pox    Migraine    04/22/08 MRI normal    Past Surgical History:  Procedure Laterality Date   EYE SURGERY  03/2020   NO PAST SURGERIES     Family History  Adopted: Yes  Problem Relation Age of Onset   Cancer Neg Hx    Diabetes Neg Hx    Hyperlipidemia Neg Hx    Hypertension Neg Hx    Thyroid disease Neg Hx     Allergies: Penicillins Current Outpatient Medications on File Prior to Visit  Medication Sig Dispense Refill   Levonorgestrel-Ethinyl Estradiol (AMETHIA) 0.15-0.03 &0.01 MG tablet TAKE 1 TABLET BY MOUTH EVERYDAY AT BEDTIME 90 tablet 0   [DISCONTINUED] fexofenadine (ALLEGRA ALLERGY) 60 MG tablet Take 1 tablet (60 mg total) by mouth 2 (two) times daily. 120 tablet 1   No current facility-administered medications on file prior to visit.    Social History   Tobacco Use   Smoking status: Never   Smokeless tobacco: Never  Vaping Use   Vaping Use: Former  Substance Use Topics   Alcohol use: Yes    Alcohol/week: 5.0 standard drinks    Types: 5 Standard drinks or equivalent per week    Comment: social   Drug use: No    Review of Systems  Constitutional:  Negative for chills and fever.   Respiratory:  Negative for cough.   Cardiovascular:  Negative for chest pain, palpitations and leg swelling.  Gastrointestinal:  Negative for nausea and vomiting.  Musculoskeletal:  Positive for arthralgias.     Objective:    BP 98/68 (BP Location: Left Arm, Patient Position: Sitting, Cuff Size: Large)   Pulse 63   Temp 98.5 F (36.9 C) (Oral)   Ht 5\' 8"  (1.727 m)   Wt 183 lb 6.4 oz (83.2 kg)   SpO2 99%   BMI 27.89 kg/m    Physical Exam Vitals reviewed.  Constitutional:      Appearance: She is well-developed.  Eyes:     Conjunctiva/sclera: Conjunctivae normal.  Cardiovascular:     Rate and Rhythm: Normal rate and regular rhythm.     Pulses: Normal pulses.     Heart sounds: Normal heart sounds.  Pulmonary:     Effort: Pulmonary effort is normal.     Breath sounds: Normal breath sounds. No wheezing, rhonchi or rales.  Musculoskeletal:     Left knee: No swelling or bony tenderness. No tenderness.     Left lower leg: No swelling. No edema.     Comments: Bilateral knees are symmetric. No effusion appreciated. No increase in warmth or erythema. Crepitus  felt with flexion of bilateral knees.  Right knee:  Able to extend to -5 to 10 degrees and flex to 110 degrees. Pain with extension. No catching with McMurray maneuver. No patellar apprehension. Negative anterior drawer and lachman's- no laxity appreciated.  No calf tenderness of lower leg edema bilaterally.    Skin:    General: Skin is warm and dry.  Neurological:     Mental Status: She is alert.  Psychiatric:        Speech: Speech normal.        Behavior: Behavior normal.        Thought Content: Thought content normal.       Assessment & Plan:   Problem List Items Addressed This Visit       Other   Right knee pain - Primary    Symptoms consistent with patellofemoral syndrome. Conservative therapy with prednisone course, prn mobic, icing. She declines imaging today of knee.  Exercises provided. Close follow  up.       Relevant Medications   predniSONE (DELTASONE) 10 MG tablet   meloxicam (MOBIC) 7.5 MG tablet       I have discontinued Brettney L. Coles's nitrofurantoin (macrocrystal-monohydrate) and Loryna. I am also having her start on predniSONE and meloxicam. Additionally, I am having her maintain her Levonorgestrel-Ethinyl Estradiol.   Meds ordered this encounter  Medications   predniSONE (DELTASONE) 10 MG tablet    Sig: Take 40 mg by mouth on day 1, then taper 10 mg daily until gone    Dispense:  10 tablet    Refill:  0    Order Specific Question:   Supervising Provider    Answer:   Sherlene Shams [[id number]]   meloxicam (MOBIC) 7.5 MG tablet    Sig: Take 1 tablet (7.5 mg total) by mouth daily as needed for pain.    Dispense:  30 tablet    Refill:  1    Order Specific Question:   Supervising Provider    Answer:   Sherlene Shams [[id number]]    Return precautions given.   Risks, benefits, and alternatives of the medications and treatment plan prescribed today were discussed, and patient expressed understanding.   Education regarding symptom management and diagnosis given to patient on AVS.  Continue to follow with [name], FNP for routine health maintenance.   Rachel Compton and I agreed with plan.   [name], FNP

## 2021-03-01 ENCOUNTER — Ambulatory Visit: Payer: BC Managed Care – PPO | Admitting: Family

## 2021-03-09 ENCOUNTER — Ambulatory Visit: Payer: BC Managed Care – PPO | Admitting: Family

## 2021-03-15 ENCOUNTER — Other Ambulatory Visit: Payer: Self-pay | Admitting: Obstetrics and Gynecology

## 2021-03-15 DIAGNOSIS — N921 Excessive and frequent menstruation with irregular cycle: Secondary | ICD-10-CM

## 2021-03-29 ENCOUNTER — Ambulatory Visit: Payer: BC Managed Care – PPO | Admitting: Family

## 2021-03-29 ENCOUNTER — Other Ambulatory Visit: Payer: Self-pay

## 2021-03-29 ENCOUNTER — Encounter: Payer: Self-pay | Admitting: Family

## 2021-03-29 VITALS — BP 122/84 | HR 83 | Ht 67.99 in | Wt 181.4 lb

## 2021-03-29 DIAGNOSIS — G8929 Other chronic pain: Secondary | ICD-10-CM | POA: Diagnosis not present

## 2021-03-29 DIAGNOSIS — F419 Anxiety disorder, unspecified: Secondary | ICD-10-CM | POA: Insufficient documentation

## 2021-03-29 DIAGNOSIS — M25561 Pain in right knee: Secondary | ICD-10-CM | POA: Diagnosis not present

## 2021-03-29 MED ORDER — HYDROXYZINE HCL 10 MG PO TABS
10.0000 mg | ORAL_TABLET | Freq: Two times a day (BID) | ORAL | 0 refills | Status: DC | PRN
Start: 2021-03-29 — End: 2021-06-02

## 2021-03-29 NOTE — Patient Instructions (Signed)
For anxiety, as discussed Atarax 10 mg twice a day as needed. May continue to use meloxicam for right knee pain as needed as well.  Continue icing and wearing neoprene sleeve  please ensure you are taking mobic with food.  I Have also placed a referral to physical therapy. Let us know if you dont hear back within a week in regards to an appointment being scheduled.

## 2021-03-29 NOTE — Progress Notes (Signed)
Subjective:    Patient ID: Rachel Compton, female    DOB: November 06, 1990, 30 y.o.   MRN: [id number]  CC: [name] is a 30 y.o. female who presents today for an acute visit.    HPI: Increased anxiety the past couple months. 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 She feels she is not motivated.  She feels worried. She has trouble staying asleep.  Drinks one redbull most days at work ( 3 days per week).   No h/o suicide attempt.  No thoughts of hurting herself or anyone else .denies depression.  She is more interested in taking medication as needed for anxiety   Continues to complain of right generalized knee pain x 5 months, unchanged. Waxes and wanes. No falls, new injury, swelling.   She completed prednisone course without improvement.  She wears neoprene sleeve with mild improvement .she is continues meloxicam 7.5mg  3x per week.     HISTORY:  Past Medical History:  Diagnosis Date   Asthma    Chicken pox    Migraine    04/22/08 MRI normal    Past Surgical History:  Procedure Laterality Date   EYE SURGERY  03/2020   NO PAST SURGERIES     Family History  Adopted: Yes  Problem Relation Age of Onset   Cancer Neg Hx    Diabetes Neg Hx    Hyperlipidemia Neg Hx    Hypertension Neg Hx    Thyroid disease Neg Hx     Allergies: Penicillins Current Outpatient Medications on File Prior to Visit  Medication Sig Dispense Refill   Levonorgestrel-Ethinyl Estradiol (AMETHIA) 0.15-0.03 &0.01 MG tablet TAKE 1 TABLET BY MOUTH EVERYDAY AT BEDTIME 90 tablet 0   meloxicam (MOBIC) 7.5 MG tablet Take 1 tablet (7.5 mg total) by mouth daily as needed for pain. 30 tablet 1   predniSONE (DELTASONE) 10 MG tablet Take 40 mg by mouth on day 1, then taper 10 mg daily until gone (Patient not taking: Reported on 03/29/2021) 10 tablet 0   [DISCONTINUED] fexofenadine (ALLEGRA ALLERGY) 60 MG tablet  Take 1 tablet (60 mg total) by mouth 2 (two) times daily. 120 tablet 1   No current facility-administered medications on file prior to visit.    Social History   Tobacco Use   Smoking status: Never   Smokeless tobacco: Never  Vaping Use   Vaping Use: Former  Substance Use Topics   Alcohol use: Yes    Alcohol/week: 5.0 standard drinks    Types: 5 Standard drinks or equivalent per week    Comment: social   Drug use: No    Review of Systems  Constitutional:  Negative for chills and fever.  Respiratory:  Negative for cough.   Cardiovascular:  Negative for chest pain, palpitations and leg swelling.  Gastrointestinal:  Negative for nausea and vomiting.  Musculoskeletal:  Positive for arthralgias. Negative for joint swelling.  Psychiatric/Behavioral:  Positive for sleep disturbance. Negative for suicidal ideas. The patient is nervous/anxious.      Objective:    BP 122/84   Pulse 83   Ht 5' 7.99" (1.727 m)   Wt 181 lb 6.4 oz (82.3 kg)   SpO2 99%   BMI 27.59 kg/m    Physical Exam Vitals reviewed.  Constitutional:      Appearance: She is well-developed.  Eyes:     Conjunctiva/sclera: Conjunctivae normal.  Cardiovascular:  Rate and Rhythm: Normal rate and regular rhythm.     Pulses: Normal pulses.     Heart sounds: Normal heart sounds.  Pulmonary:     Effort: Pulmonary effort is normal.     Breath sounds: Normal breath sounds. No wheezing, rhonchi or rales.  Musculoskeletal:     Right knee: No swelling, effusion or erythema. No tenderness.     Left knee: Swelling present. No effusion or erythema. No tenderness.     Comments: Bilateral knees are symmetric. No effusion appreciated. No increase in warmth or erythema. Crepitus felt with flexion of bilateral knees.  Right knee:  Able to extend to -5 to 10 degrees and flex to 110 degrees. No catching with McMurray maneuver. No patellar apprehension. Negative anterior drawer and lachman's- no laxity appreciated.  No calf  tenderness of lower leg edema bilaterally.    Skin:    General: Skin is warm and dry.  Neurological:     Mental Status: She is alert.  Psychiatric:        Speech: Speech normal.        Behavior: Behavior normal.        Thought Content: Thought content normal.       Assessment & Plan:   Problem List Items Addressed This Visit       Other   Anxiety    Uncontrolled.  Counseled on SSRI being more affected for generalized anxiety disorder.  Her preference is to start a medication as needed.  We will continue to assess.  Start Atarax 10 mg twice daily as needed.  Close follow-up.      Relevant Medications   hydrOXYzine (ATARAX/VISTARIL) 10 MG tablet   Other Relevant Orders   TSH   Right knee pain - Primary    Uncontrolle.d Failed conservative therapy.  Pending x-rays, physical therapy.  If no improvement, advised consult with orthopedics.  Continue to advise icing regimen, meloxicam as needed as needed with food.  Offered orthopedic referral today however patient politely declined as she would like to try physical therapy first.      Relevant Orders   DG Knee Complete 4 Views Right   Ambulatory referral to Physical Therapy      I am having [name] start on hydrOXYzine. I am also having her maintain her Levonorgestrel-Ethinyl Estradiol, predniSONE, and meloxicam.   Meds ordered this encounter  Medications   hydrOXYzine (ATARAX/VISTARIL) 10 MG tablet    Sig: Take 1 tablet (10 mg total) by mouth 2 (two) times daily as needed for anxiety.    Dispense:  90 tablet    Refill:  0    Order Specific Question:   Supervising Provider    Answer:   Sherlene Shams [[id number]]    Return precautions given.   Risks, benefits, and alternatives of the medications and treatment plan prescribed today were discussed, and patient expressed understanding.   Education regarding symptom management and diagnosis given to patient on AVS.  Continue to follow with [name], FNP for  routine health maintenance.   Rachel Compton and I agreed with plan.   [name], FNP

## 2021-03-30 NOTE — Assessment & Plan Note (Addendum)
Uncontrolle.d Failed conservative therapy.  Pending x-rays, physical therapy.  If no improvement, advised consult with orthopedics.  Continue to advise icing regimen, meloxicam as needed as needed with food.  Offered orthopedic referral today however patient politely declined as she would like to try physical therapy first.

## 2021-03-30 NOTE — Assessment & Plan Note (Signed)
Uncontrolled.  Counseled on SSRI being more affected for generalized anxiety disorder.  Her preference is to start a medication as needed.  We will continue to assess.  Start Atarax 10 mg twice daily as needed.  Close follow-up.

## 2021-04-13 ENCOUNTER — Other Ambulatory Visit (INDEPENDENT_AMBULATORY_CARE_PROVIDER_SITE_OTHER): Payer: BC Managed Care – PPO

## 2021-04-13 ENCOUNTER — Other Ambulatory Visit: Payer: Self-pay

## 2021-04-13 ENCOUNTER — Ambulatory Visit (INDEPENDENT_AMBULATORY_CARE_PROVIDER_SITE_OTHER): Payer: BC Managed Care – PPO

## 2021-04-13 DIAGNOSIS — G8929 Other chronic pain: Secondary | ICD-10-CM

## 2021-04-13 DIAGNOSIS — M25561 Pain in right knee: Secondary | ICD-10-CM | POA: Diagnosis not present

## 2021-04-13 DIAGNOSIS — N939 Abnormal uterine and vaginal bleeding, unspecified: Secondary | ICD-10-CM

## 2021-04-13 DIAGNOSIS — F419 Anxiety disorder, unspecified: Secondary | ICD-10-CM | POA: Diagnosis not present

## 2021-04-13 LAB — TSH: TSH: 1.48 u[IU]/mL (ref 0.35–5.50)

## 2021-04-19 LAB — PROTEIN S, TOTAL: PROTEIN S ANTIGEN, TOTAL: 91 % normal (ref 70–140)

## 2021-04-19 LAB — VON WILLEBRAND PANEL
Factor-VIII Activity: 67 % normal (ref 50–180)
Ristocetin Co-Factor: 109 % normal (ref 42–200)
Von Willebrand Antigen, Plasma: 123 % (ref 50–217)
aPTT: 29 s (ref 23–32)

## 2021-04-19 LAB — PROTEIN C, TOTAL: PROTEIN C, ANTIGEN: 77 % (ref 70–140)

## 2021-04-19 LAB — PROTEIN S ACTIVITY: Protein S Activity: 93 % (ref 60–140)

## 2021-04-23 ENCOUNTER — Ambulatory Visit: Payer: Self-pay | Admitting: Family Medicine

## 2021-06-02 ENCOUNTER — Encounter: Payer: Self-pay | Admitting: Family

## 2021-06-02 ENCOUNTER — Telehealth (INDEPENDENT_AMBULATORY_CARE_PROVIDER_SITE_OTHER): Payer: BC Managed Care – PPO | Admitting: Family

## 2021-06-02 VITALS — Ht 67.99 in | Wt 179.0 lb

## 2021-06-02 DIAGNOSIS — M25561 Pain in right knee: Secondary | ICD-10-CM | POA: Diagnosis not present

## 2021-06-02 DIAGNOSIS — F419 Anxiety disorder, unspecified: Secondary | ICD-10-CM

## 2021-06-02 MED ORDER — SERTRALINE HCL 50 MG PO TABS: 50.0000 mg | ORAL_TABLET | Freq: Every day | ORAL | 3 refills | Status: DC

## 2021-06-02 MED ORDER — HYDROXYZINE HCL 10 MG PO TABS: 10.0000 mg | ORAL_TABLET | Freq: Every evening | ORAL | 0 refills | Status: DC | PRN

## 2021-06-02 NOTE — Progress Notes (Signed)
Virtual Visit via Video Note  I connected with@  on 06/02/21 at  8:00 AM EDT by a video enabled telemedicine application and verified that I am speaking with the correct person using two identifiers.  Location patient: home Location provider:work  Persons participating in the virtual visit: patient, provider  I discussed the limitations of evaluation and management by telemedicine and the availability of in person appointments. The patient expressed understanding and agreed to proceed.   HPI:  Follow up anxiety.  Last seen in August which we started Atarax 10 mg twice a day as needed.  This medication is making her particularly drowsy.  She is interested in trying a new medication. Feeling more stressed, from work and home.  She has used atarax 10mg  at qhs with some relief. Sleep is still very interrupted.  No depression.  No si/hi  Right knee pain- pain better today.  Pain tends to come and go.  She is unable to start physical therapy due to schedule, work.   ROS: See pertinent positives and negatives per HPI.    EXAM:  VITALS per patient if applicable: Ht 5' 7.99" (1.727 m)   Wt 179 lb (81.2 kg)   BMI 27.22 kg/m  BP Readings from Last 3 Encounters:  03/29/21 122/84  01/15/21 98/68  08/05/20 110/70   Wt Readings from Last 3 Encounters:  06/02/21 179 lb (81.2 kg)  03/29/21 181 lb 6.4 oz (82.3 kg)  01/15/21 183 lb 6.4 oz (83.2 kg)    GENERAL: alert, oriented, appears well and in no acute distress  HEENT: atraumatic, conjunttiva clear, no obvious abnormalities on inspection of external nose and ears  NECK: normal movements of the head and neck  LUNGS: on inspection no signs of respiratory distress, breathing rate appears normal, no obvious gross SOB, gasping or wheezing  CV: no obvious cyanosis  MS: moves all visible extremities without noticeable abnormality  PSYCH/NEURO: pleasant and cooperative, no obvious depression or anxiety, speech and thought processing  grossly intact  ASSESSMENT AND PLAN:  Discussed the following assessment and plan:  Problem List Items Addressed This Visit       Other   Anxiety - Primary    Suboptimal control.  Atarax too sedating for her.  Advise she may keep Atarax 10 mg to use nightly as needed for insomnia.  We agreed to go ahead and start SSRI, Zoloft 50 mg nightly.  Patient will let me know how she is doing follow-up in 6 weeks      Relevant Medications   sertraline (ZOLOFT) 50 MG tablet   hydrOXYzine (ATARAX/VISTARIL) 10 MG tablet   Right knee pain    Unchanged.  Patient politely declines further evaluation at this time including orthopedic referral or physical therapy.  I provided her with knee rehab exercises to perform at home.  She will call me let me know of any new concerns       -we discussed possible serious and likely etiologies, options for evaluation and workup, limitations of telemedicine visit vs in person visit, treatment, treatment risks and precautions. Pt prefers to treat via telemedicine empirically rather then risking or undertaking an in person visit at this moment.  .   I discussed the assessment and treatment plan with the patient. The patient was provided an opportunity to ask questions and all were answered. The patient agreed with the plan and demonstrated an understanding of the instructions.   The patient was advised to call back or seek an in-person evaluation if  the symptoms worsen or if the condition fails to improve as anticipated.   Mable Paris, FNP

## 2021-06-02 NOTE — Assessment & Plan Note (Signed)
Suboptimal control.  Atarax too sedating for her.  Advise she may keep Atarax 10 mg to use nightly as needed for insomnia.  We agreed to go ahead and start SSRI, Zoloft 50 mg nightly.  Patient will let me know how she is doing follow-up in 6 weeks

## 2021-06-02 NOTE — Assessment & Plan Note (Signed)
Unchanged.  Patient politely declines further evaluation at this time including orthopedic referral or physical therapy.  I provided her with knee rehab exercises to perform at home.  She will call me let me know of any new concerns

## 2021-06-02 NOTE — Patient Instructions (Signed)
As Atarax is quite sedating for you, you may keep this medication and just use at bedtime to help with trouble sleeping. Start Zoloft at bedtime/supper.   Knee exercises below as discussed  Let me know if any concerns  Knee Exercises Ask your health care provider which exercises are safe for you. Do exercises exactly as told by your health care provider and adjust them as directed. It is normal to feel mild stretching, pulling, tightness, or discomfort as you do these exercises. Stop right away if you feel sudden pain or your pain gets worse. Do not begin these exercises until told by your health care provider. Stretching and range-of-motion exercises These exercises warm up your muscles and joints and improve the movement and flexibility of your knee. These exercises also help to relieve pain and swelling. Knee extension, prone Lie on your abdomen (prone position) on a bed. Place your left / right knee just beyond the edge of the surface so your knee is not on the bed. You can put a towel under your left / right thigh just above your kneecap for comfort. Relax your leg muscles and allow gravity to straighten your knee (extension). You should feel a stretch behind your left / right knee. Hold this position for __________ seconds. Scoot up so your knee is supported between repetitions. Repeat __________ times. Complete this exercise __________ times a day. Knee flexion, active  Lie on your back with both legs straight. If this causes back discomfort, bend your left / right knee so your foot is flat on the floor. Slowly slide your left / right heel back toward your buttocks. Stop when you feel a gentle stretch in the front of your knee or thigh (flexion). Hold this position for __________ seconds. Slowly slide your left / right heel back to the starting position. Repeat __________ times. Complete this exercise __________ times a day. Quadriceps stretch, prone  Lie on your abdomen on a firm  surface, such as a bed or padded floor. Bend your left / right knee and hold your ankle. If you cannot reach your ankle or pant leg, loop a belt around your foot and grab the belt instead. Gently pull your heel toward your buttocks. Your knee should not slide out to the side. You should feel a stretch in the front of your thigh and knee (quadriceps). Hold this position for __________ seconds. Repeat __________ times. Complete this exercise __________ times a day. Hamstring, supine Lie on your back (supine position). Loop a belt or towel over the ball of your left / right foot. The ball of your foot is on the walking surface, right under your toes. Straighten your left / right knee and slowly pull on the belt to raise your leg until you feel a gentle stretch behind your knee (hamstring). Do not let your knee bend while you do this. Keep your other leg flat on the floor. Hold this position for __________ seconds. Repeat __________ times. Complete this exercise __________ times a day. Strengthening exercises These exercises build strength and endurance in your knee. Endurance is the ability to use your muscles for a long time, even after they get tired. Quadriceps, isometric This exercise stretches the muscles in front of your thigh (quadriceps) without moving your knee joint (isometric). Lie on your back with your left / right leg extended and your other knee bent. Put a rolled towel or small pillow under your knee if told by your health care provider. Slowly tense the muscles  in the front of your left / right thigh. You should see your kneecap slide up toward your hip or see increased dimpling just above the knee. This motion will push the back of the knee toward the floor. For __________ seconds, hold the muscle as tight as you can without increasing your pain. Relax the muscles slowly and completely. Repeat __________ times. Complete this exercise __________ times a day. Straight leg  raises This exercise stretches the muscles in front of your thigh (quadriceps) and the muscles that move your hips (hip flexors). Lie on your back with your left / right leg extended and your other knee bent. Tense the muscles in the front of your left / right thigh. You should see your kneecap slide up or see increased dimpling just above the knee. Your thigh may even shake a bit. Keep these muscles tight as you raise your leg 4-6 inches (10-15 cm) off the floor. Do not let your knee bend. Hold this position for __________ seconds. Keep these muscles tense as you lower your leg. Relax your muscles slowly and completely after each repetition. Repeat __________ times. Complete this exercise __________ times a day. Hamstring, isometric Lie on your back on a firm surface. Bend your left / right knee about __________ degrees. Dig your left / right heel into the surface as if you are trying to pull it toward your buttocks. Tighten the muscles in the back of your thighs (hamstring) to "dig" as hard as you can without increasing any pain. Hold this position for __________ seconds. Release the tension gradually and allow your muscles to relax completely for __________ seconds after each repetition. Repeat __________ times. Complete this exercise __________ times a day. Hamstring curls If told by your health care provider, do this exercise while wearing ankle weights. Begin with __________ lb weights. Then increase the weight by 1 lb (0.5 kg) increments. Do not wear ankle weights that are more than __________ lb. Lie on your abdomen with your legs straight. Bend your left / right knee as far as you can without feeling pain. Keep your hips flat against the floor. Hold this position for __________ seconds. Slowly lower your leg to the starting position. Repeat __________ times. Complete this exercise __________ times a day. Squats This exercise strengthens the muscles in front of your thigh and knee  (quadriceps). Stand in front of a table, with your feet and knees pointing straight ahead. You may rest your hands on the table for balance but not for support. Slowly bend your knees and lower your hips like you are going to sit in a chair. Keep your weight over your heels, not over your toes. Keep your lower legs upright so they are parallel with the table legs. Do not let your hips go lower than your knees. Do not bend lower than told by your health care provider. If your knee pain increases, do not bend as low. Hold the squat position for __________ seconds. Slowly push with your legs to return to standing. Do not use your hands to pull yourself to standing. Repeat __________ times. Complete this exercise __________ times a day. Wall slides This exercise strengthens the muscles in front of your thigh and knee (quadriceps). Lean your back against a smooth wall or door, and walk your feet out 18-24 inches (46-61 cm) from it. Place your feet hip-width apart. Slowly slide down the wall or door until your knees bend __________ degrees. Keep your knees over your heels, not over your  toes. Keep your knees in line with your hips. Hold this position for __________ seconds. Repeat __________ times. Complete this exercise __________ times a day. Straight leg raises This exercise strengthens the muscles that rotate the leg at the hip and move it away from your body (hip abductors). Lie on your side with your left / right leg in the top position. Lie so your head, shoulder, knee, and hip line up. You may bend your bottom knee to help you keep your balance. Roll your hips slightly forward so your hips are stacked directly over each other and your left / right knee is facing forward. Leading with your heel, lift your top leg 4-6 inches (10-15 cm). You should feel the muscles in your outer hip lifting. Do not let your foot drift forward. Do not let your knee roll toward the ceiling. Hold this position  for __________ seconds. Slowly return your leg to the starting position. Let your muscles relax completely after each repetition. Repeat __________ times. Complete this exercise __________ times a day. Straight leg raises This exercise stretches the muscles that move your hips away from the front of the pelvis (hip extensors). Lie on your abdomen on a firm surface. You can put a pillow under your hips if that is more comfortable. Tense the muscles in your buttocks and lift your left / right leg about 4-6 inches (10-15 cm). Keep your knee straight as you lift your leg. Hold this position for __________ seconds. Slowly lower your leg to the starting position. Let your leg relax completely after each repetition. Repeat __________ times. Complete this exercise __________ times a day. This information is not intended to replace advice given to you by your health care provider. Make sure you discuss any questions you have with your health care provider. Document Revised: 05/08/2018 Document Reviewed: 05/08/2018 Elsevier Patient Education  2022 [employer].

## 2021-06-09 IMAGING — US US PELVIS COMPLETE WITH TRANSVAGINAL
2 series · 14 of 25 positions shown · non-contrast
Comparison: None

CLINICAL DATA: Irregular menses, intermenstrual bleeding,
menorrhagia with irregular cycle, LMP 05/15/2020

EXAM:
TRANSABDOMINAL AND TRANSVAGINAL ULTRASOUND OF PELVIS
TECHNIQUE: Both transabdominal and transvaginal ultrasound examinations of the
pelvis were performed. Transabdominal technique was performed for
global imaging of the pelvis including uterus, ovaries, adnexal
regions, and pelvic cul-de-sac. It was necessary to proceed with
endovaginal exam following the transabdominal exam to visualize the
endometrium and ovaries.

[Series 1: us pelvis complete with transvaginal · 0.20mm/px · 13 of 127 slices shown (1 of 2)]
[im 1/127]
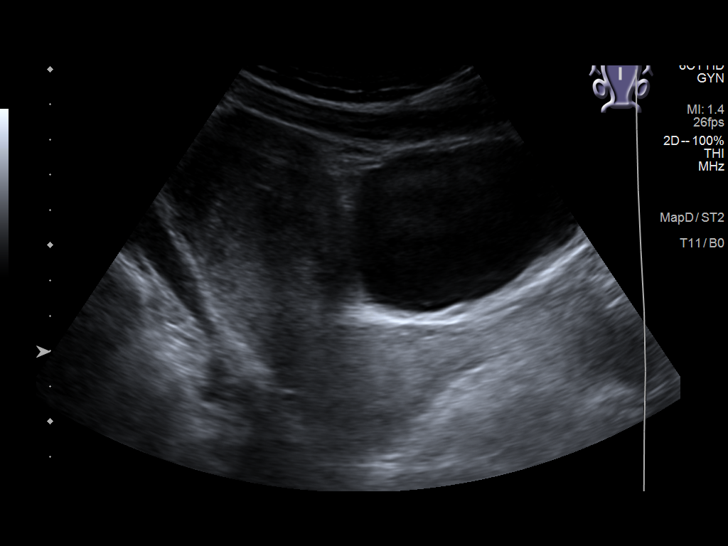
[im 11/127]
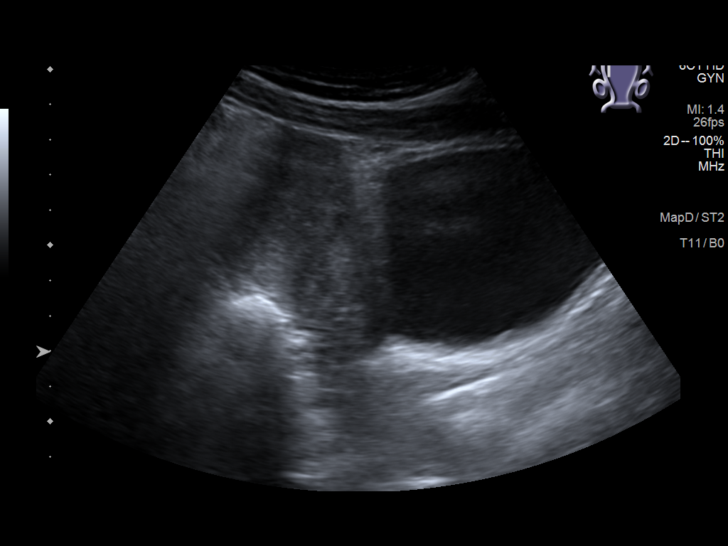
[im 22/127]
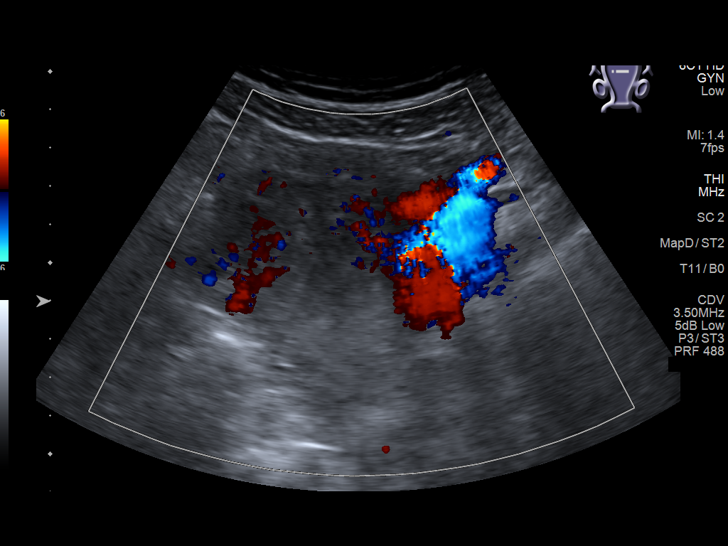
[im 33/127]
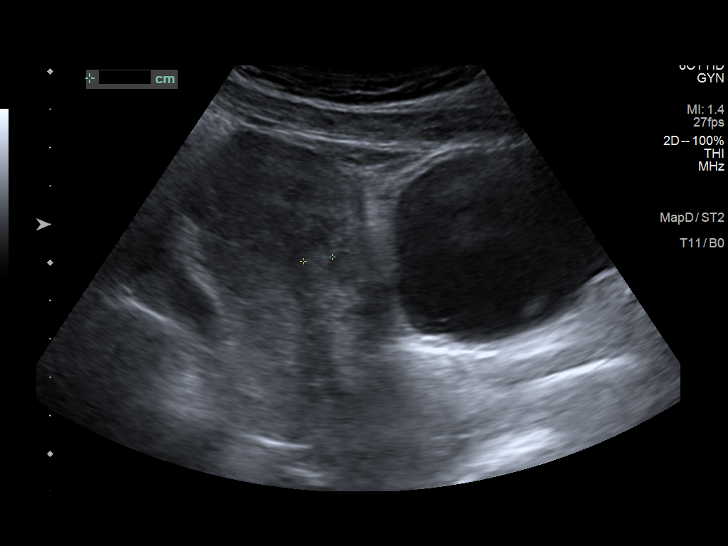
[im 44/127]
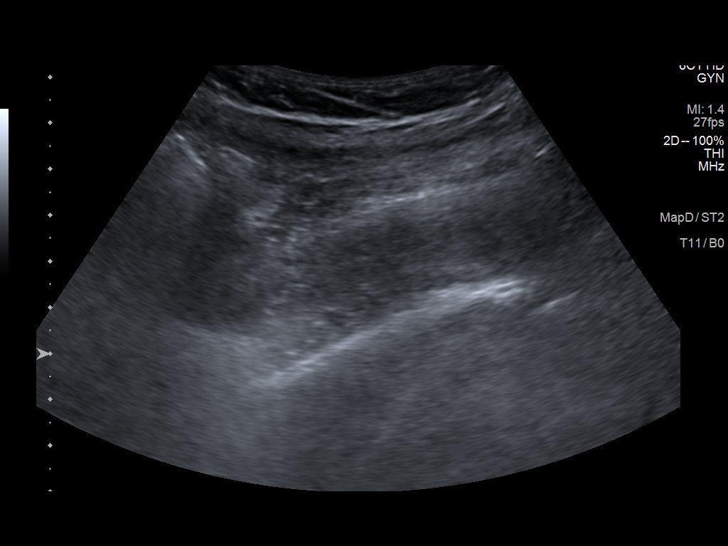
[im 50/127]
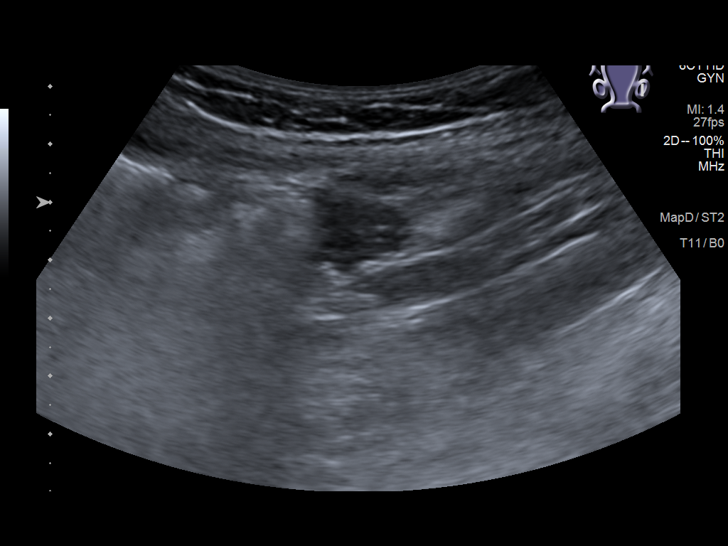
[im 61/127]
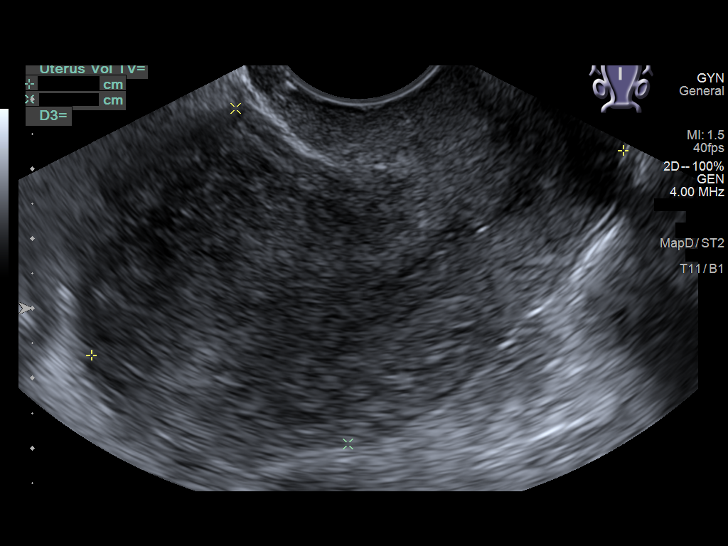
[im 72/127]
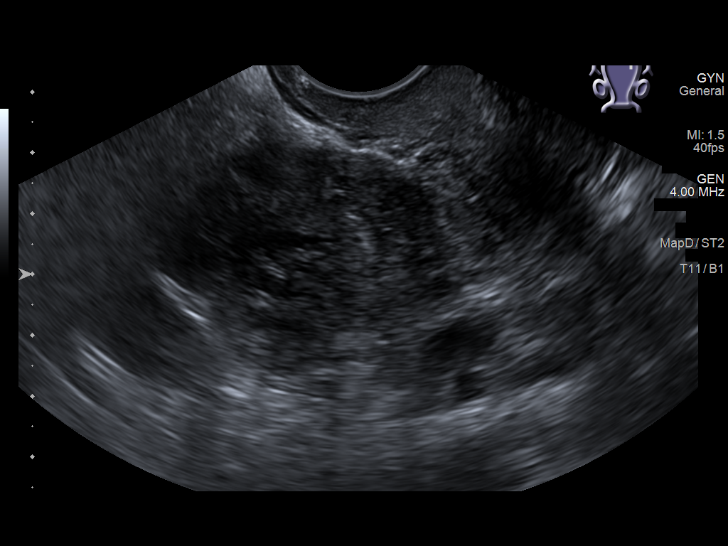
[im 83/127]
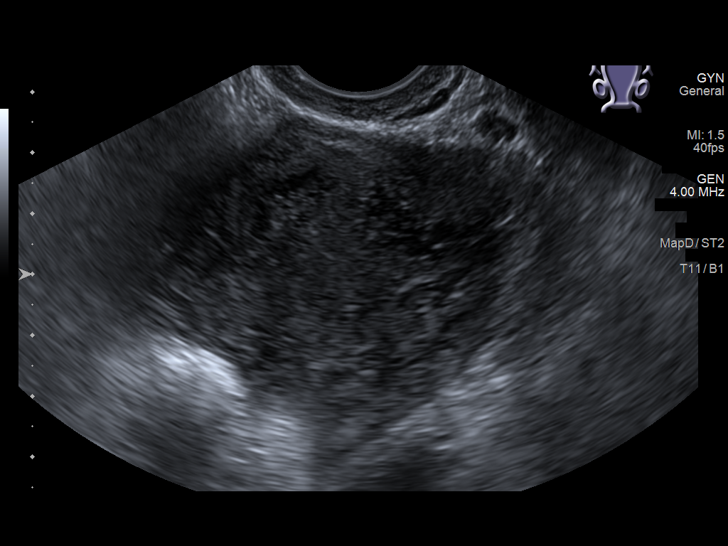
[im 88/127]
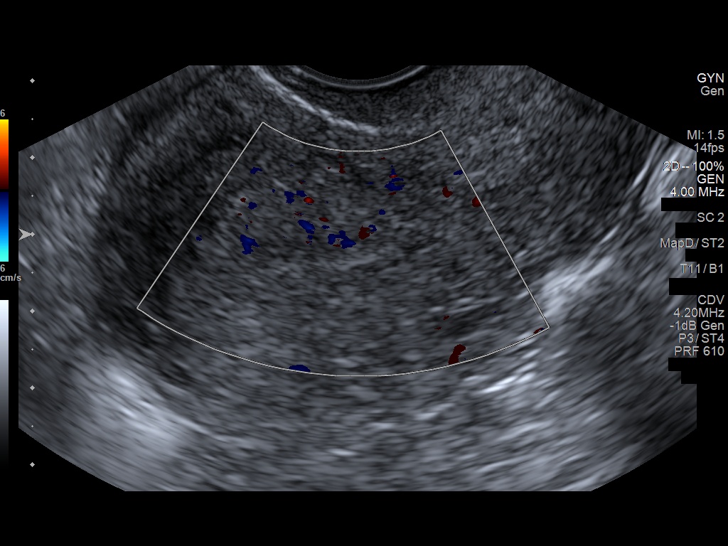
[im 99/127]
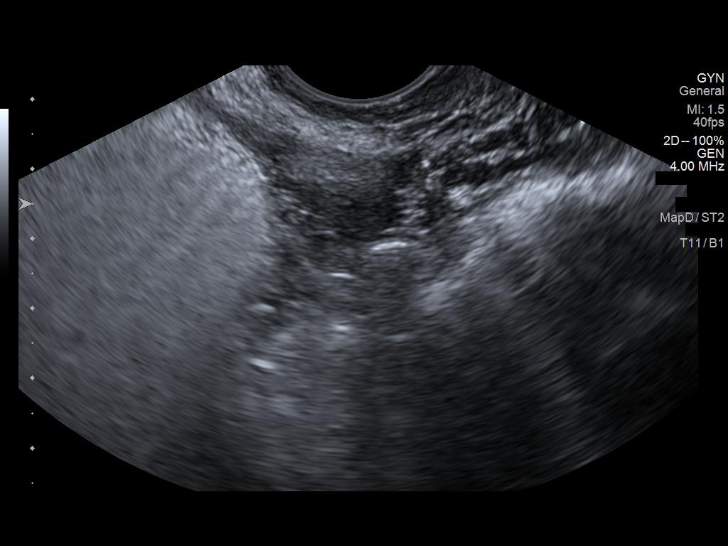
[im 110/127]
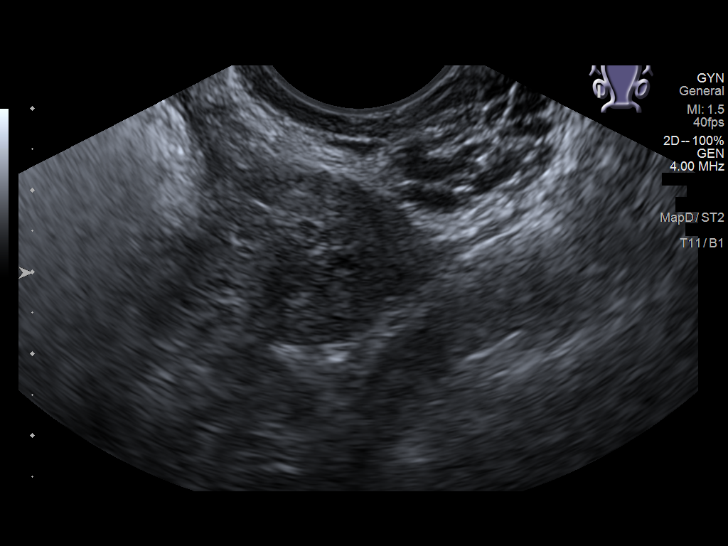
[im 121/127]
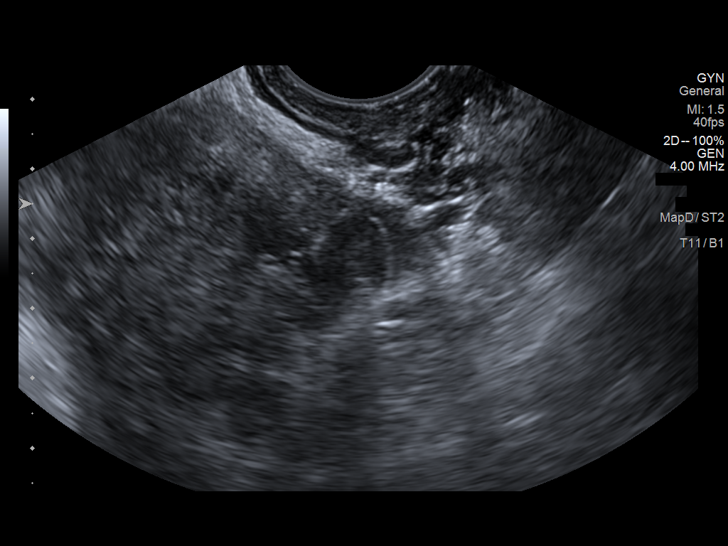

[Series 1001: us pelvis complete with transvaginal · 0.10mm/px · 1 of 1 slices shown (2 of 2)]
[im 1/1]
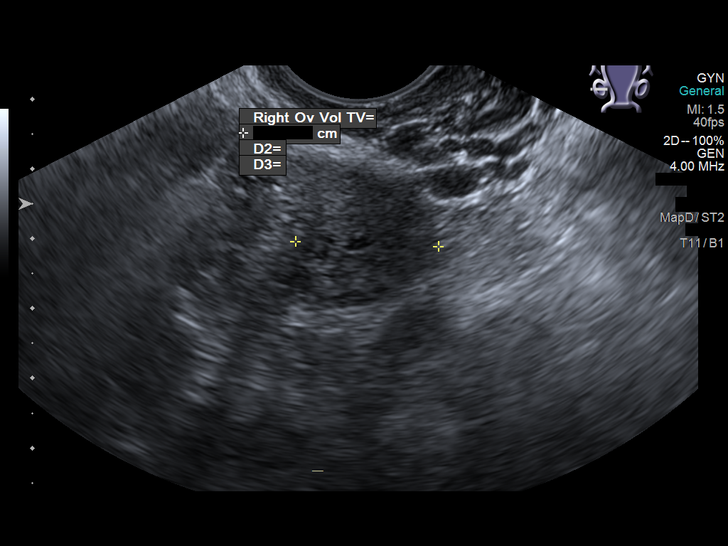

[14 of 25 positions shown; findings below may reference images not displayed]

FINDINGS: Uterus

Measurements: 10.4 x 4.5 x 5.4 cm = volume: 133 mL. Heterogeneous
myometrium. No focal mass.

Endometrium

Thickness: 7 mm.  No endometrial fluid or focal abnormality

Right ovary

Measurements: 1.9 x 1.8 x 2.1 cm = volume: 3.7 mL. Normal morphology
without mass

Left ovary

Measurements: 1.7 x 1.2 x 1.3 cm = volume: 1.4 mL. Normal morphology
without mass

Other findings

No free pelvic fluid.  No adnexal masses.
IMPRESSION: Normal exam.

## 2021-08-06 ENCOUNTER — Other Ambulatory Visit: Payer: Self-pay

## 2021-08-06 ENCOUNTER — Ambulatory Visit
Admission: EM | Admit: 2021-08-06 | Discharge: 2021-08-06 | Disposition: A | Discharge status: Home / Self Care | Payer: BC Managed Care – PPO | Attending: Physician Assistant | Admitting: Physician Assistant

## 2021-08-06 DIAGNOSIS — Z23 Encounter for immunization: Secondary | ICD-10-CM

## 2021-08-06 DIAGNOSIS — S61412A Laceration without foreign body of left hand, initial encounter: Secondary | ICD-10-CM

## 2021-08-06 HISTORY — DX: Stevens-Johnson syndrome: L51.1

## 2021-08-06 MED ORDER — TETANUS-DIPHTH-ACELL PERTUSSIS 5-2.5-18.5 LF-MCG/0.5 IM SUSY
0.5000 mL | PREFILLED_SYRINGE | Freq: Once | INTRAMUSCULAR | Status: AC
  Administered 2021-08-06: 0.5 mL via INTRAMUSCULAR

## 2021-08-06 NOTE — ED Provider Notes (Signed)
[facility]    CSN: [id number] Arrival date & time: 08/06/21  1731      History   Chief Complaint No chief complaint on file.   HPI Rachel Compton is a 31 y.o. female.   Patient complains of a laceration to her left hand patient reports she was cutting a piece of bread and cut her hand.  Patient denies any numbness or tingling she denies any loss of range of motion patient denies any other areas of injury  The history is provided by the patient. No language interpreter was used.  Laceration  Past Medical History:  Diagnosis Date   Asthma    Chicken pox    Migraine    04/22/08 MRI normal    Stevens-Johnson syndrome [facility](HCC)     Patient Active Problem List   Diagnosis Date Noted   Anxiety 03/29/2021   Right knee pain 01/15/2021   UTI (urinary tract infection) 08/05/2020   Abnormal vaginal bleeding 06/17/2020   Vaginal itching 05/11/2020   Migraines 03/25/2020   Membranous conjunctivitis 03/22/2020   Blistering rash 03/15/2020   Stevens-Johnson syndrome (HCC) 03/12/2020   Breast lump in upper outer quadrant 09/27/2019   Acute otalgia, right 08/27/2019   Cerumen impaction 08/27/2019   Constipation 02/22/2019   Lymphadenopathy 04/16/2018   Other fatigue 02/09/2018   Enlarged thyroid 02/09/2018   Right ankle pain 11/15/2017   Allergic rhinitis 11/15/2017   Boil 10/04/2017   Acute nonintractable headache 02/23/2017   Bloating 02/23/2017   Annual physical exam 06/09/2016   Active labor at term 04/18/2013   Other specified antenatal screening(V28.89) 03/12/2013   Anemia 02/12/2013   Polydactyly, fetal, affecting care of mother, antepartum 01/11/2013    Past Surgical History:  Procedure Laterality Date   EYE SURGERY  03/2020   NO PAST SURGERIES      OB History     Gravida  2   Para  2   Term  2   Preterm      AB      Living  2      SAB      IAB      Ectopic      Multiple      Live Births   2            Home Medications    Prior to Admission medications   Medication Sig Start Date End Date Taking? Authorizing Provider  hydrOXYzine (ATARAX/VISTARIL) 10 MG tablet Take 1 tablet (10 mg total) by mouth at bedtime as needed for anxiety. 06/02/21   Allegra GranaArnett, Margaret G, FNP  Levonorgestrel-Ethinyl Estradiol (AMETHIA) 0.15-0.03 &0.01 MG tablet TAKE 1 TABLET BY MOUTH EVERYDAY AT BEDTIME 12/18/20   Linzie CollinEvans, David James, MD  meloxicam (MOBIC) 7.5 MG tablet Take 1 tablet (7.5 mg total) by mouth daily as needed for pain. 01/15/21   Allegra GranaArnett, Margaret G, FNP  sertraline (ZOLOFT) 50 MG tablet Take 1 tablet (50 mg total) by mouth at bedtime. 06/02/21   Allegra GranaArnett, Margaret G, FNP  fexofenadine (ALLEGRA ALLERGY) 60 MG tablet Take 1 tablet (60 mg total) by mouth 2 (two) times daily. 12/02/19 05/11/20  Allegra GranaArnett, Margaret G, FNP    Family History Family History  Adopted: Yes  Problem Relation Age of Onset   Cancer Neg Hx    Diabetes Neg Hx    Hyperlipidemia Neg Hx    Hypertension Neg Hx    Thyroid disease Neg Hx     Social History Social History   Tobacco  Use   Smoking status: Never   Smokeless tobacco: Never  Vaping Use   Vaping Use: Former  Substance Use Topics   Alcohol use: Yes    Alcohol/week: 5.0 standard drinks    Types: 5 Standard drinks or equivalent per week    Comment: social   Drug use: No     Allergies   Penicillins   Review of Systems Review of Systems  All other systems reviewed and are negative.   Physical Exam Triage Vital Signs ED Triage Vitals [08/06/21 1752]  Enc Vitals Group     BP 123/82     Pulse Rate 82     Resp 18     Temp 98.3 F (36.8 C)     Temp Source Oral     SpO2 98 %     Weight      Height      Head Circumference      Peak Flow      Pain Score 0     Pain Loc      Pain Edu?      Excl. in GC?    No data found.  Updated Vital Signs BP 123/82 (BP Location: Left Arm)    Pulse 82    Temp 98.3 F (36.8 C) (Oral)    Resp 18     SpO2 98%   Visual Acuity Right Eye Distance:   Left Eye Distance:   Bilateral Distance:    Right Eye Near:   Left Eye Near:    Bilateral Near:     Physical Exam Vitals reviewed.  Constitutional:      Appearance: Normal appearance.  Cardiovascular:     Rate and Rhythm: Normal rate.  Pulmonary:     Effort: Pulmonary effort is normal.  Musculoskeletal:        General: Swelling and tenderness present.     Comments: 2 cm laceration left palm at base of second metacarpal range of motion neurovascular neurosensory intact  Skin:    General: Skin is warm.  Neurological:     General: No focal deficit present.     Mental Status: She is alert.  Psychiatric:        Mood and Affect: Mood normal.     UC Treatments / Results  Labs (all labs ordered are listed, but only abnormal results are displayed) Labs Reviewed - No data to display  EKG   Radiology No results found.  Procedures Laceration Repair  Date/Time: 08/06/2021 6:40 PM Performed by: Elson Areas, PA-C Authorized by: Elson Areas, PA-C   Consent:    Consent obtained:  Verbal   Consent given by:  Patient   Risks discussed:  Infection Universal protocol:    Patient identity confirmed:  Verbally with patient Anesthesia:    Anesthesia method:  None Laceration details:    Location:  Hand   Hand location:  L palm   Length (cm):  2 Pre-procedure details:    Preparation:  Patient was prepped and draped in usual sterile fashion Exploration:    Wound exploration: entire depth of wound visualized     Wound extent: no tendon damage noted and no vascular damage noted   Treatment:    Area cleansed with:  Soap and water   Amount of cleaning:  Standard   Irrigation solution:  Sterile saline Skin repair:    Repair method:  Tissue adhesive Repair type:    Repair type:  Simple (including critical care time)  Medications Ordered in  UC Medications  Tdap (BOOSTRIX) injection 0.5 mL (0.5 mLs Intramuscular Given  08/06/21 1836)    Initial Impression / Assessment and Plan / UC Course  I have reviewed the triage vital signs and the nursing notes.  Pertinent labs & imaging results that were available during my care of the patient were reviewed by me and considered in my medical decision making (see chart for details).     MDM patient counseled on Dermabond Final Clinical Impressions(s) / UC Diagnoses   Final diagnoses:  Laceration of left hand without foreign body, initial encounter     Discharge Instructions      Return if any problems    ED Prescriptions   None    PDMP not reviewed this encounter.   Elson Areas, [place] 08/06/21 1841

## 2021-08-06 NOTE — Discharge Instructions (Signed)
Return if any problems.

## 2021-08-06 NOTE — ED Triage Notes (Signed)
Pt c/o laceration to left hand via knife when cutting bread yesterday.

## 2021-09-08 ENCOUNTER — Encounter: Payer: BC Managed Care – PPO | Admitting: Family

## 2021-09-15 ENCOUNTER — Other Ambulatory Visit: Payer: Self-pay

## 2021-09-15 ENCOUNTER — Encounter: Payer: Self-pay | Admitting: Family

## 2021-09-15 ENCOUNTER — Ambulatory Visit: Payer: BC Managed Care – PPO | Admitting: Family

## 2021-09-15 VITALS — BP 98/64 | HR 83 | Temp 98.3°F | Ht 68.0 in | Wt 189.6 lb

## 2021-09-15 DIAGNOSIS — K5900 Constipation, unspecified: Secondary | ICD-10-CM

## 2021-09-15 NOTE — Assessment & Plan Note (Signed)
Etiology unclear. Pending ab xr,  h pylori and celiac screen. Advised to trial miralax and titrate for consistent BM. 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 Education regarding FOD MAP diet provided.  Advised over-the-counter Pepcid AC for intermittent epigastric pain. if no resolution, advised GI consult. She declines today and would like to trial the above first.

## 2021-09-15 NOTE — Patient Instructions (Addendum)
Pending lab studies, x-ray.  You may also start Pepcid AC over-the-counter 1 tablet at night if you continue to have any acid reflux symptoms.  I have also included dietary information about the FODMAP diet which is commonly used in irritable bowel which can also present with constipation  Constipation plan  1) start taking MiraLAX, half a dose every day or every other day and change the dose as needed after 3 to 5 days with goal of 1-2 soft bowel movements every day or every other day. MiraLAX is an osmotic laxative. That means it draws water into the colon, which softens the stool and may naturally stimulate the colon to contract. These actions help ease bowel movements. For example, you  may find that using the medication every other day or three times a week is a good bowel regimen for you. Or perhaps, twice weekly.   2)  If you do not get results with Miralax alone, you may then add Bisacodyl (dulocolax) suppository daily to regimen until you get desired bowel results. Please remember that stimulant laxatives such as senna and bisacodyl ( above) trigger contractions in the bowels that push the stool along. But if you take stimulant laxatives too often, you could become dependent on them to have a bowel movement at all--possibly because the bowel has stopped functioning normally.Please let me know if you are having to use bisacodyl on a regular basis.   3) Take Colace ( stool softener) twice daily every day.   It is MOST important to drink LOTS of water and follow a HIGH fiber diet to keep foods moving through the gut. You may add Metamucil to a beverage that you drink.  Information on prevention of constipation as well as acute treatment for constipation as included below.  If there is no improvement in your symptoms, or if there is any worsening of symptoms, or if you have any additional concerns, please return to this clinic for re-evaluation; or, if we are closed, consider going to the  Emergency Room for evaluation.    Constipation Prevention What is Constipation? Constipation is hard, dry bowel movements or the inability to have a bowel movement.  You can also feel like you need to have a bowel movement but not be able to.  It can also be painful when you strain to have a bowel movement.  Taking narcotic pain medicine after surgery can make you constipated, even if you have never had a problem with constipation. What Do I Need To Do? The best thing to do for constipation is to keep it from happening.  This can be done by: Adding laxatives to your daily routine, when taking prescription pain medicines after surgery. Add 17 gm Miralax daily or 100 mg Colace once or twice daily. (Miralax is mixed in water. Colace is a pill). They soften your bowel movements to make them easier to pass and hurt less. Drink plenty of water to help flush your bowels.  (Eight, 8 ounce glasses daily) Eat foods high in fiber such as whole grains, vegetables, cereals, fruits, and prune juice (5-7 servings a day or 25 grams).  If you do not know how much fiber a food has in it you can look on the label under dietary fiber.  If you have trouble getting enough fiber in your diet you may want to consider a fiber supplement such as Metamucil or Citrucel.  Also, be aware that eating fiber without drinking enough water can make constipation worse. If you  do become constipated some medications that may help are: Bisacodyl (Dulcolax) is available in tablet form or a suppository. Glycerin suppositories are also a good choice if you need a fast acting medication. Everybody is different and may have different results.  Talk to your pharmacist or health care provider about your specific problems. They can help you choose the best product for you.  Why Is It Important for Me To Do This? Being constipated is not something you have to live with.  There are many things you can do to help.   Feeling bad can interfere  with your recovery after surgery.  If constipation goes on for too long it can become a very serious medical problem. You may need to visit your doctor or go to the hospital.  That is why it is very important to drink lots of water, eat enough fiber, and keep it from happening.  Ask Questions We want to answer all of your questions and concerns.  Thats why we encourage you to use a program called Ask Me 3, created by the [employer].  By using Ask Me 3 you are encouraged to ask 3 simple (yet, potentially life saving questions) whenever you are talking with your physician, nurse or pharmacist: What is my main problem? What do I need to do? Why is it important for me to do this? By understanding the answer to these three questions and any other questions you may have, you have the knowledge necessary to manage your health. Please feel very comfortable asking any questions. Healthcare is complicated, so if you hear an answer you do not understand, please ask your health care team to explain again.   Sources: [employer] 05-20-10 N

## 2021-09-15 NOTE — Addendum Note (Signed)
Addended by: Marijo Sanes on: 09/15/2021 02:38 PM   Modules accepted: Orders

## 2021-09-15 NOTE — Progress Notes (Signed)
Subjective:    Patient ID: Rachel Compton, female    DOB: 08-15-1990, 31 y.o.   MRN: [id number]  CC: Rachel Compton is a 31 y.o. female who presents today for an acute visit.    HPI: Complains of constipation, abdominal distention  No blood in stool . Stool is hard and she is having to strain.  No fever, ruq pain, dysuria, pain with swallowing.   Endorses gas pain, some belching, epigastric burning  She has been alzselter chews with some relief of 'heart burn'  She has avoided lactose without relief.   She may have BM twice per week She has been taking supergreen, pre and probiotics. sennalot without much relief.   She is not on iron.    Menses are regular.  She has period every 3 months due to cycling birth control.   HISTORY:  Past Medical History:  Diagnosis Date   Asthma    Chicken pox    Migraine    04/22/08 MRI normal    Stevens-Johnson syndrome [facility])    Past Surgical History:  Procedure Laterality Date   EYE SURGERY  03/2020   NO PAST SURGERIES     Family History  Adopted: Yes  Problem Relation Age of Onset   Cancer Neg Hx    Diabetes Neg Hx    Hyperlipidemia Neg Hx    Hypertension Neg Hx    Thyroid disease Neg Hx     Allergies: Penicillins Current Outpatient Medications on File Prior to Visit  Medication Sig Dispense Refill   Levonorgestrel-Ethinyl Estradiol (AMETHIA) 0.15-0.03 &0.01 MG tablet TAKE 1 TABLET BY MOUTH EVERYDAY AT BEDTIME 90 tablet 0   [DISCONTINUED] fexofenadine (ALLEGRA ALLERGY) 60 MG tablet Take 1 tablet (60 mg total) by mouth 2 (two) times daily. 120 tablet 1   No current facility-administered medications on file prior to visit.    Social History   Tobacco Use   Smoking status: Never   Smokeless tobacco: Never  Vaping Use   Vaping Use: Former  Substance Use Topics   Alcohol use: Yes    Alcohol/week: 5.0 standard drinks    Types: 5 Standard drinks or equivalent per week    Comment: social   Drug use: No     Review of Systems  Constitutional:  Negative for chills and fever.  Respiratory:  Negative for cough.   Cardiovascular:  Negative for chest pain and palpitations.  Gastrointestinal:  Positive for abdominal distention and constipation. Negative for abdominal pain, blood in stool, diarrhea, nausea and vomiting.     Objective:    BP 98/64 (BP Location: Left Arm, Patient Position: Sitting, Cuff Size: Large)    Pulse 83    Temp 98.3 F (36.8 C) (Oral)    Ht 5\' 8"  (1.727 m)    Wt 189 lb 9.6 oz (86 kg)    SpO2 98%    BMI 28.83 kg/m    Physical Exam Vitals reviewed.  Constitutional:      Appearance: Normal appearance. She is well-developed.  Eyes:     Conjunctiva/sclera: Conjunctivae normal.  Cardiovascular:     Rate and Rhythm: Normal rate and regular rhythm.     Pulses: Normal pulses.     Heart sounds: Normal heart sounds.  Pulmonary:     Effort: Pulmonary effort is normal.     Breath sounds: Normal breath sounds. No wheezing, rhonchi or rales.  Abdominal:     General: Bowel sounds are normal. There is no distension.  Palpations: Abdomen is soft. Abdomen is not rigid. There is no fluid wave or mass.     Tenderness: There is no abdominal tenderness. There is no guarding or rebound.     Comments: Soft, nondistended. She is not guarding  Skin:    General: Skin is warm and dry.  Neurological:     Mental Status: She is alert.  Psychiatric:        Speech: Speech normal.        Behavior: Behavior normal.        Thought Content: Thought content normal.       Assessment & Plan:   Problem List Items Addressed This Visit       Other   Constipation - Primary    Etiology unclear. Pending ab xr,  h pylori and celiac screen. Advised to trial miralax and titrate for consistent BM. 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 Education regarding FOD MAP diet provided.  Advised over-the-counter Pepcid AC for intermittent epigastric pain. if no resolution,  advised GI consult. She declines today and would like to trial the above first.        Relevant Orders   Celiac Disease Ab Screen w/Rfx   DG Abd 2 Views   H. pylori breath test      I have discontinued [place] L. Pacitti's meloxicam, sertraline, and hydrOXYzine. I am also having her maintain her Levonorgestrel-Ethinyl Estradiol.   No orders of the defined types were placed in this encounter.   Return precautions given.   Risks, benefits, and alternatives of the medications and treatment plan prescribed today were discussed, and patient expressed understanding.   Education regarding symptom management and diagnosis given to patient on AVS.  Continue to follow with [name], FNP for routine health maintenance.   Rachel Compton and I agreed with plan.   [name], FNP

## 2021-09-16 ENCOUNTER — Other Ambulatory Visit: Payer: BC Managed Care – PPO

## 2021-09-17 ENCOUNTER — Other Ambulatory Visit: Payer: Self-pay

## 2021-09-17 ENCOUNTER — Other Ambulatory Visit (INDEPENDENT_AMBULATORY_CARE_PROVIDER_SITE_OTHER): Payer: BC Managed Care – PPO

## 2021-09-17 DIAGNOSIS — K5900 Constipation, unspecified: Secondary | ICD-10-CM

## 2021-09-21 ENCOUNTER — Telehealth: Payer: BC Managed Care – PPO | Admitting: Physician Assistant

## 2021-09-21 DIAGNOSIS — R3989 Other symptoms and signs involving the genitourinary system: Secondary | ICD-10-CM | POA: Diagnosis not present

## 2021-09-21 LAB — CELIAC DISEASE AB SCREEN W/RFX
Antigliadin Abs, IgA: 3 units (ref 0–19)
IgA/Immunoglobulin A, Serum: 82 mg/dL — ABNORMAL LOW (ref 87–352)
Transglutaminase IgA: <2 U/mL (ref 0–3)

## 2021-09-21 LAB — H. PYLORI BREATH TEST: H. pylori Breath Test: NOT DETECTED

## 2021-09-21 MED ORDER — SULFAMETHOXAZOLE-TRIMETHOPRIM 800-160 MG PO TABS: 1.0000 | ORAL_TABLET | Freq: Two times a day (BID) | ORAL | 0 refills | Status: DC

## 2021-09-21 NOTE — Progress Notes (Signed)

## 2021-10-08 ENCOUNTER — Encounter: Payer: Self-pay | Admitting: Internal Medicine

## 2021-10-08 ENCOUNTER — Ambulatory Visit: Payer: BC Managed Care – PPO | Admitting: Internal Medicine

## 2021-10-08 ENCOUNTER — Other Ambulatory Visit: Payer: Self-pay

## 2021-10-08 ENCOUNTER — Telehealth: Payer: Self-pay | Admitting: Family

## 2021-10-08 VITALS — BP 106/72 | HR 78 | Temp 98.2°F | Resp 16 | Ht 68.0 in | Wt 187.1 lb

## 2021-10-08 DIAGNOSIS — R220 Localized swelling, mass and lump, head: Secondary | ICD-10-CM

## 2021-10-08 DIAGNOSIS — B0230 Zoster ocular disease, unspecified: Secondary | ICD-10-CM | POA: Diagnosis not present

## 2021-10-08 MED ORDER — VALACYCLOVIR HCL 1 G PO TABS: 1000.0000 mg | ORAL_TABLET | Freq: Three times a day (TID) | ORAL | 0 refills | Status: DC

## 2021-10-08 MED ORDER — GABAPENTIN 100 MG PO CAPS: 100.0000 mg | ORAL_CAPSULE | Freq: Every day | ORAL | 1 refills | Status: DC

## 2021-10-08 NOTE — Telephone Encounter (Signed)
Pt called in stating she has an allergic reaction on the right side of her face since last night. Pt stated her eye and face is swollen and she has a rash along her hair line that is painful. Sent to access nurse ?

## 2021-10-08 NOTE — Patient Instructions (Addendum)
Ok to take benadryl as needed for itching  Tylenol and ibuprofen for pain as needed   Gabapentin Capsules or Tablets What is this medication? GABAPENTIN (GA ba pen tin) treats nerve pain. It may also be used to prevent and control seizures in people with epilepsy. It works by calming overactive nerves in your body. This medicine may be used for other purposes; ask your health care provider or pharmacist if you have questions. COMMON BRAND NAME(S): Active-PAC with Gabapentin, Ascencion Dike, Gralise, Neurontin What should I tell my care team before I take this medication? They need to know if you have any of these conditions: Alcohol or substance use disorder Kidney disease Lung or breathing disease Suicidal thoughts, plans, or attempt; a previous suicide attempt by you or a family member An unusual or allergic reaction to gabapentin, other medications, foods, dyes, or preservatives Pregnant or trying to get pregnant Breast-feeding How should I use this medication? Take this medication by mouth with a glass of water. Follow the directions on the prescription label. You can take it with or without food. If it upsets your stomach, take it with food. Take your medication at regular intervals. Do not take it more often than directed. Do not stop taking except on your care team's advice. If you are directed to break the 600 or 800 mg tablets in half as part of your dose, the extra half tablet should be used for the next dose. If you have not used the extra half tablet within 28 days, it should be thrown away. A special MedGuide will be given to you by the pharmacist with each prescription and refill. Be sure to read this information carefully each time. Talk to your care team about the use of this medication in children. While this medication may be prescribed for children as young as 3 years for selected conditions, precautions do apply. Overdosage: If you think you have taken too much of this medicine  contact a poison control center or emergency room at once. NOTE: This medicine is only for you. Do not share this medicine with others. What if I miss a dose? If you miss a dose, take it as soon as you can. If it is almost time for your next dose, take only that dose. Do not take double or extra doses. What may interact with this medication? Alcohol Antihistamines for allergy, cough, and cold Certain medications for anxiety or sleep Certain medications for depression like amitriptyline, fluoxetine, sertraline Certain medications for seizures like phenobarbital, primidone Certain medications for stomach problems General anesthetics like halothane, isoflurane, methoxyflurane, propofol Local anesthetics like lidocaine, pramoxine, tetracaine Medications that relax muscles for surgery Opioid medications for pain Phenothiazines like chlorpromazine, mesoridazine, prochlorperazine, thioridazine This list may not describe all possible interactions. Give your health care provider a list of all the medicines, herbs, non-prescription drugs, or dietary supplements you use. Also tell them if you smoke, drink alcohol, or use illegal drugs. Some items may interact with your medicine. What should I watch for while using this medication? Visit your care team for regular checks on your progress. You may want to keep a record at home of how you feel your condition is responding to treatment. You may want to share this information with your care team at each visit. You should contact your care team if your seizures get worse or if you have any new types of seizures. Do not stop taking this medication or any of your seizure medications unless instructed by your care  team. Stopping your medication suddenly can increase your seizures or their severity. This medication may cause serious skin reactions. They can happen weeks to months after starting the medication. Contact your care team right away if you notice fevers or  flu-like symptoms with a rash. The rash may be red or purple and then turn into blisters or peeling of the skin. Or, you might notice a red rash with swelling of the face, lips or lymph nodes in your neck or under your arms. Wear a medical identification bracelet or chain if you are taking this medication for seizures. Carry a card that lists all your medications. This medication may affect your coordination, reaction time, or judgment. Do not drive or operate machinery until you know how this medication affects you. Sit up or stand slowly to reduce the risk of dizzy or fainting spells. Drinking alcohol with this medication can increase the risk of these side effects. Your mouth may get dry. Chewing sugarless gum or sucking hard candy, and drinking plenty of water may help. Watch for new or worsening thoughts of suicide or depression. This includes sudden changes in mood, behaviors, or thoughts. These changes can happen at any time but are more common in the beginning of treatment or after a change in dose. Call your care team right away if you experience these thoughts or worsening depression. If you become pregnant while using this medication, you may enroll in the [place] American Antiepileptic Drug Pregnancy Registry by calling [phone]. This registry collects information about the safety of antiepileptic medication use during pregnancy. What side effects may I notice from receiving this medication? Side effects that you should report to your care team as soon as possible: Allergic reactions or angioedema--skin rash, itching, hives, swelling of the face, eyes, lips, tongue, arms, or legs, trouble swallowing or breathing Rash, fever, and swollen lymph nodes Thoughts of suicide or self harm, worsening mood, feelings of depression Trouble breathing Unusual changes in mood or behavior in children after use such as difficulty concentrating, hostility, or restlessness Side effects that usually do not  require medical attention (report to your care team if they continue or are bothersome): Dizziness Drowsiness Nausea Swelling of ankles, feet, or hands Vomiting This list may not describe all possible side effects. Call your doctor for medical advice about side effects. You may report side effects to FDA at 1-800-FDA-1088. Where should I keep my medication? Keep out of reach of children and pets. Store at room temperature between 15 and 30 degrees C (59 and 86 degrees F). Get rid of any unused medication after the expiration date. This medication may cause accidental overdose and death if taken by other adults, children, or pets. To get rid of medications that are no longer needed or have expired: Take the medication to a medication take-back program. Check with your pharmacy or law enforcement to find a location. If you cannot return the medication, check the label or package insert to see if the medication should be thrown out in the garbage or flushed down the toilet. If you are not sure, ask your care team. If it is safe to put it in the trash, empty the medication out of the container. Mix the medication with cat litter, dirt, coffee grounds, or other unwanted substance. Seal the mixture in a bag or container. Put it in the trash. NOTE: This sheet is a summary. It may not cover all possible information. If you have questions about this medicine, talk to your doctor,  pharmacist, or health care provider.  2022 Elsevier/Gold Standard (2021-01-18 00:00:00)  Valacyclovir Tablets What is this medication? VALACYCLOVIR (val ay SYE kloe veer) helps manage infections, such as cold sores, genital herpes, shingles, and chickenpox, caused by viruses. It will not treat colds, the flu, or infections caused by bacteria. This medicine may be used for other purposes; ask your health care provider or pharmacist if you have questions. COMMON BRAND NAME(S): Valtrex What should I tell my care team before I  take this medication? They need to know if you have any of these conditions: Acquired immunodeficiency syndrome (AIDS) Any other condition that may weaken the immune system Bone marrow or kidney transplant Kidney disease An unusual or allergic reaction to valacyclovir, acyclovir, ganciclovir, valganciclovir, other medications, foods, dyes, or preservatives Pregnant or trying to get pregnant Breast-feeding How should I use this medication? Take this medication by mouth with a glass of water. Follow the directions on the prescription label. You can take this medication with or without food. Take your doses at regular intervals. Do not take your medication more often than directed. Finish the full course prescribed by your care team even if you think your condition is better. Do not stop taking except on the advice of your care team. Talk to your care team about the use of this medication in children. While this medication may be prescribed for children as young as 2 years for selected conditions, precautions do apply. Overdosage: If you think you have taken too much of this medicine contact a poison control center or emergency room at once. NOTE: This medicine is only for you. Do not share this medicine with others. What if I miss a dose? If you miss a dose, take it as soon as you can. If it is almost time for your next dose, take only that dose. Do not take double or extra doses. What may interact with this medication? Do not take this medication with any of the following: Cidofovir This medication may also interact with the following: Adefovir Amphotericin B Certain antibiotics like amikacin, gentamicin, tobramycin, vancomycin Cimetidine Cisplatin Colistin Cyclosporine Foscarnet Lithium Methotrexate Probenecid Tacrolimus This list may not describe all possible interactions. Give your health care provider a list of all the medicines, herbs, non-prescription drugs, or dietary  supplements you use. Also tell them if you smoke, drink alcohol, or use illegal drugs. Some items may interact with your medicine. What should I watch for while using this medication? Tell your care team if your symptoms do not start to get better after 1 week. This medication works best when taken early in the course of an infection, within the first 72 hours. Begin treatment as soon as possible after the first signs of infection like tingling, itching, or pain in the affected area. It is possible that genital herpes may still be spread even when you are not having symptoms. Always use safer sex practices like condoms made of latex or polyurethane whenever you have sexual contact. You should stay well hydrated while taking this medication. Drink plenty of fluids. What side effects may I notice from receiving this medication? Side effects that you should report to your care team as soon as possible: Allergic reactions--skin rash, itching, hives, swelling of the face, lips, tongue, or throat Confusion Hallucinations Kidney injury--decrease in the amount of urine, swelling of the ankles, hands, or feet Seizures Side effects that usually do not require medical attention (report to your care team if they continue or are bothersome): Headache  Nausea Stomach pain This list may not describe all possible side effects. Call your doctor for medical advice about side effects. You may report side effects to FDA at 1-800-FDA-1088. Where should I keep my medication? Keep out of the reach of children. Store at room temperature between 15 and 25 degrees C (59 and 77 degrees F). Keep container tightly closed. Throw away any unused medication after the expiration date. NOTE: This sheet is a summary. It may not cover all possible information. If you have questions about this medicine, talk to your doctor, pharmacist, or health care provider.  2022 Elsevier/Gold Standard (2020-09-25  00:00:00)  Shingles Shingles, which is also known as herpes zoster, is an infection that causes a painful skin rash and fluid-filled blisters. It is caused by a virus. Shingles only develops in people who: Have had chickenpox. Have been vaccinated against chickenpox. Shingles is rare in this group. What are the causes? Shingles is caused by varicella-zoster virus. This is the same virus that causes chickenpox. After a person is exposed to the virus, it stays in the body in an inactive (dormant) state. Shingles develops if the virus is reactivated. This can happen many years after the first (initial) exposure to the virus. It is not known what causes this virus to be reactivated. What increases the risk? People who have had chickenpox or received the chickenpox vaccine are at risk for shingles. Shingles infection is more common in people who: Are older than 33 years of age. Have a weakened disease-fighting system (immune system), such as people with: HIV (human immunodeficiency virus). AIDS (acquired immunodeficiency syndrome). Cancer. Are taking medicines that weaken the immune system, such as organ transplant medicines. Are experiencing a lot of stress. What are the signs or symptoms? Early symptoms of this condition include itching, tingling, and pain in an area on your skin. Pain may be described as burning, stabbing, or throbbing. A few days or weeks after early symptoms start, a painful red rash appears. The rash is usually on one side of the body and has a band-like or belt-like pattern. The rash eventually turns into fluid-filled blisters that break open, change into scabs, and dry up in about 2-3 weeks. At any time during the infection, you may also develop: A fever. Chills. A headache. Nausea. How is this diagnosed? This condition is diagnosed with a skin exam. Skin or fluid samples (a culture) may be taken from the blisters before a diagnosis is made. How is this treated? The  rash may last for several weeks. There is not a specific cure for this condition. Your health care provider may prescribe medicines to help you manage pain, recover more quickly, and avoid long-term problems. Medicines may include: Antiviral medicines. Anti-inflammatory medicines. Pain medicines. Anti-itching medicines (antihistamines). If the area involved is on your face, you may be referred to a specialist, such as an eye doctor (ophthalmologist) or an ear, nose, and throat (ENT) doctor (otorhinolaryngologist) to help you avoid eye problems, chronic pain, or disability. Follow these instructions at home: Medicines Take over-the-counter and prescription medicines only as told by your health care provider. Apply an anti-itch cream or numbing cream to the affected area as told by your health care provider. Relieving itching and discomfort  Apply cold, wet cloths (cold compresses) to the area of the rash or blisters as told by your health care provider. Cool baths can be soothing. Try adding baking soda or dry oatmeal to the water to reduce itching. Do not bathe in hot  water. Use calamine lotion as recommended by your health care provider. This is an over-the-counter lotion that helps to relieve itchiness. Blister and rash care Keep your rash covered with a loose bandage (dressing). Wear loose-fitting clothing to help ease the pain of material rubbing against the rash. Wash your hands with soap and water for at least 20 seconds before and after you change your dressing. If soap and water are not available, use hand sanitizer. Change your dressing as told by your health care provider. Keep your rash and blisters clean by washing the area with mild soap and cool water as told by your health care provider. Check your rash every day for signs of infection. Check for: More redness, swelling, or pain. Fluid or blood. Warmth. Pus or a bad smell. Do not scratch your rash or pick at your blisters. To  help avoid scratching: Keep your fingernails clean and cut short. Wear gloves or mittens while you sleep, if scratching is a problem. General instructions Rest as told by your health care provider. Wash your hands often with soap and water for at least 20 seconds. If soap and water are not available, use hand sanitizer. Doing this lowers your chance of getting a bacterial skin infection. Before your blisters change into scabs, your shingles infection can cause chickenpox in people who have never had it or have never been vaccinated against it. To prevent this from happening, avoid contact with other people, especially: Babies. Pregnant women. Children who have eczema. Older people who have transplants. People who have chronic illnesses, such as cancer or AIDS. Keep all follow-up visits. This is important. How is this prevented? Getting vaccinated is the best way to prevent shingles and protect against shingles complications. If you have not been vaccinated, talk with your health care provider about getting the vaccine. Where to find more information Centers for Disease Control and Prevention: [employer] Contact a health care provider if: Your pain is not relieved with prescribed medicines. Your pain does not get better after the rash heals. You have any of these signs of infection: More redness, swelling, or pain around the rash. Fluid or blood coming from the rash. Warmth coming from your rash. Pus or a bad smell coming from the rash. A fever. Get help right away if: The rash is on your face or nose. You have facial pain, pain around your eye area, or loss of feeling on one side of your face. You have difficulty seeing. You have ear pain or have ringing in your ear. You have a loss of taste. Your condition gets worse. Summary Shingles, also known as herpes zoster, is an infection that causes a painful skin rash and fluid-filled blisters. This condition is diagnosed with a skin  exam. Skin or fluid samples (a culture) may be taken from the blisters. Keep your rash covered with a loose bandage (dressing). Wear loose-fitting clothing to help ease the pain of material rubbing against the rash. Before your blisters change into scabs, your shingles infection can cause chickenpox in people who have never had it or have never been vaccinated against it. This information is not intended to replace advice given to you by your health care provider. Make sure you discuss any questions you have with your health care provider. Document Revised: 07/13/2020 Document Reviewed: 07/13/2020 Elsevier Patient Education  2022 [employer].

## 2021-10-08 NOTE — Telephone Encounter (Signed)
[facility] Night - Client ?TELEPHONE ADVICE RECORD ?AccessNurse? ?Patient ?Name: ?Taneisha PA ?Compton ?Gender: Female ?DOB: 07/02/1991 ?Age: 31 Y 7 M 11 D ?Return ?Phone ?Number: ?[phone] ?(Primary) ?Address: ?City/ ?State/ ?Zip: [place] ? [address] ?Client [facility] Night - Client ?Client Site  [facility] [place] - Night ?Provider AA - PHYSICIAN, UNKNOWN- MD ?Contact Type Call ?Who Is Calling Patient / Member / Family / Caregiver ?Call Type Triage / Clinical ?Relationship To Patient Self ?Return Phone Number [phone] (Primary) ?Chief Complaint Allergic reaction (unknown symptoms) ?Reason for Call Symptomatic / Request for Health Information ?Initial Comment Caller states she has had an allergic reaction. She ?states she has a rash on her face and her rt eye is ?swollen. ?Translation No ?Nurse Assessment ?Nurse: Debroah Loop, RN, Heather Date/Time (Eastern Time): 10/08/2021 7:34:25 AM ?Confirm and document reason for call. If ?symptomatic, describe symptoms. ?---Caller states she started having an allergic reaction ?yesterday morning. STarted as a pimple under her eye. ?Now side of face is painful and right eye is swollen. ?Now has a rash around her hairline. ?Does the patient have any new or worsening ?symptoms? ---Yes ?Will a triage be completed? ---Yes ?Related visit to physician within the last 2 weeks? ---No ?Does the PT have any chronic conditions? (i.e. ?diabetes, asthma, this includes High risk factors for ?pregnancy, etc.) ?---No ?Is the patient pregnant or possibly pregnant? (Ask ?all females between the ages of 65-55) ---No ?Is this a behavioral health or substance abuse call? ---No ?Guidelines ?Guideline Title Affirmed Question Affirmed Notes Nurse Date/Time (Eastern ?Time) ?Rash or Redness - ?Widespread ?Face becomes ?swollen ?Debroah Loop, RN, Heather 10/08/2021 7:36:43 ?AM ?Disp. Time (Eastern ?Time) Disposition Final User ?10/08/2021 7:39:59 AM See  HCP within 4 Hours (or ?PCP triage) ?Yes Debroah Loop, RN, Herbert Seta ?PLEASE NOTE: All timestamps contained within this report are represented as [place] Standard Time. ?CONFIDENTIALTY NOTICE: This fax transmission is intended only for the addressee. It contains information that is legally privileged, confidential or ?otherwise protected from use or disclosure. If you are not the intended recipient, you are strictly prohibited from reviewing, disclosing, copying using ?or disseminating any of this information or taking any action in reliance on or regarding this information. If you have received this fax in error, please ?notify us immediately by telephone so that we can arrange for its return to [place]. Phone: [phone], Toll-Free: [phone], Fax: [phone] ?Page: 2 of 2 ?Call Id: [id number] ?Caller Disagree/Comply Comply ?Caller Understands Yes ?PreDisposition Call Doctor ?Care Advice Given Per Guideline ?SEE HCP (OR PCP TRIAGE) WITHIN 4 HOURS: * IF OFFICE WILL BE OPEN: You need to be seen within the next 3 or 4 ?hours. Call your doctor (or NP/PA) now or as soon as the office opens. CALL BACK IF: * You become worse CARE ADVICE ?given per Rash - Widespread and Cause Unknown (Adult) guideline. ?Comments ?User: Lyman Speller, RN Date/Time Lamount Cohen Time): 10/08/2021 7:37:45 AM ?steven johnson syndrome 2 years ago ?Referrals ?REFERRED TO PCP OFFIC ?

## 2021-10-08 NOTE — Telephone Encounter (Signed)
Per appt notes pt has appt with Dr French Ana McLean-Scocuzza 10/08/21 at 1:20 pm. Per access nurse note pt was given care advice. ?

## 2021-10-08 NOTE — Progress Notes (Signed)
Chief Complaint  ?Patient presents with  ? Rash  ?  On right side of face  ? 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 ? ?Review of Systems  ?Skin:  Positive for rash.  ?Past Medical History:  ?Diagnosis Date  ? Asthma   ? Chicken pox   ? Migraine   ? 04/22/08 MRI normal   ? Stevens-Johnson syndrome (HCC)   ? b/l eyes surgery at [facility]  ? ?Past Surgical History:  ?Procedure Laterality Date  ? EYE SURGERY  03/2020  ? NO PAST SURGERIES    ? ?Family History  ?Adopted: Yes  ?Problem Relation Age of Onset  ? Cancer Neg Hx   ? Diabetes Neg Hx   ? Hyperlipidemia Neg Hx   ? Hypertension Neg Hx   ? Thyroid disease Neg Hx   ? ?Social History  ? ?Socioeconomic History  ? Marital status: Married  ?  Spouse name: Not on file  ? Number of children: Not on file  ? Years of education: Not on file  ? Highest education level: Not on file  ?Occupational History  ? Not on file  ?Tobacco Use  ? Smoking status: Never  ? Smokeless tobacco: Never  ?Vaping Use  ? Vaping Use: Former  ?Substance and Sexual Activity  ? Alcohol use: Yes  ?  Alcohol/week: 5.0 standard drinks  ?  Types: 5 Standard drinks or equivalent per week  ?  Comment: social  ? Drug use: No  ? Sexual activity: Yes  ?  Partners: Male  ?  Birth control/protection: Pill  ?Other Topics Concern  ? Not on file  ?Social History Narrative  ? Adopted - no family history  ? Engaged  ? Kids are 64 and 7.   ? Engineer, drilling  ? ?Social Determinants of Health  ? ?Financial Resource Strain: Not on file  ?Food Insecurity: Not on file  ?Transportation Needs: Not on file  ?Physical Activity: Not on file  ?Stress: Not on file  ?Social Connections: Not on file  ?Intimate Partner Violence: Not on file  ? ?Current Meds  ?Medication Sig  ? gabapentin (NEURONTIN) 100 MG capsule Take 1 capsule (100 mg total) by  mouth at bedtime.  ? Levonorgestrel-Ethinyl Estradiol (AMETHIA) 0.15-0.03 &0.01 MG tablet TAKE 1 TABLET BY MOUTH EVERYDAY AT BEDTIME  ? valACYclovir (VALTREX) 1000 MG tablet Take 1 tablet (1,000 mg total) by mouth 3 (three) times daily. With food  ? ?Allergies  ?Allergen Reactions  ? Penicillins Hives  ? ?Recent Results (from the past 2160 hour(s))  ?H. pylori breath test     Status: None  ? Collection Time: 09/17/21  2:10 PM  ?Result Value Ref Range  ? H. pylori Breath Test NOT DETECTED NOT DETECTED  ?  Comment: . ?Antimicrobials, proton pump inhibitors, and bismuth ?preparations are known to suppress H. pylori, and  ?ingestion of these prior to H. pylori diagnostic testing ?may lead to false negative results. If clinically  ?indicated, the test may be repeated on a new specimen ?obtained two weeks after discontinuing treatment. ?However, a positive result is still clinically valid. ?  ?Celiac Disease Ab Screen w/Rfx     Status: Abnormal  ? Collection Time: 09/17/21  2:10 PM  ?Result Value Ref Range  ? Antigliadin Abs, IgA 3 0 - 19  units  ?  Comment:                    Negative                   0 - 19 ?                   Weak Positive             20 - 30 ?                   Moderate to Strong Positive   >30 ?  ? Transglutaminase IgA <2 0 - 3 U/mL  ?  Comment:                               Negative        0 -  3 ?                              Weak Positive   4 - 10 ?                              Positive           >10 ? Tissue Transglutaminase (tTG) has been identified ? as the endomysial antigen.  Studies have demonstr- ? ated that endomysial IgA antibodies have over 99% ? specificity for gluten sensitive enteropathy. ?  ? IgA/Immunoglobulin A, Serum 82 (L) 87 - 352 mg/dL  ? ?Objective  ?Body mass index is 28.45 kg/m?. ?Wt Readings from Last 3 Encounters:  ?10/08/21 187 lb 2 oz (84.9 kg)  ?09/15/21 189 lb 9.6 oz (86 kg)  ?06/02/21 179 lb (81.2 kg)  ? ?Temp Readings from Last 3 Encounters:  ?10/08/21 98.2 ?F (36.8  ?C) (Oral)  ?09/15/21 98.3 ?F (36.8 ?C) (Oral)  ?08/06/21 98.3 ?F (36.8 ?C) (Oral)  ? ?BP Readings from Last 3 Encounters:  ?10/08/21 106/72  ?09/15/21 98/64  ?08/06/21 123/82  ? ?Pulse Readings from Last 3 Encounters:  ?10/08/21 78  ?09/15/21 83  ?08/06/21 82  ? ? ?Physical Exam ?Vitals and nursing note reviewed.  ?Constitutional:   ?   Appearance: Normal appearance. She is well-developed and well-groomed.  ?HENT:  ?   Head: Normocephalic and atraumatic.  ? ?Eyes:  ?   Conjunctiva/sclera: Conjunctivae normal.  ?   Pupils: Pupils are equal, round, and reactive to light.  ?Cardiovascular:  ?   Rate and Rhythm: Normal rate and regular rhythm.  ?   Heart sounds: Normal heart sounds. No murmur heard. ?Pulmonary:  ?   Effort: Pulmonary effort is normal.  ?   Breath sounds: Normal breath sounds.  ?Abdominal:  ?   General: Abdomen is flat. Bowel sounds are normal.  ?   Tenderness: There is no abdominal tenderness.  ?Musculoskeletal:     ?   General: No tenderness.  ?Skin: ?   General: Skin is warm and dry.  ?Neurological:  ?   General: No focal deficit present.  ?   Mental Status: She is alert and oriented to person, place, and time. Mental status is at baseline.  ?   Cranial Nerves: Cranial nerves 2-12 are intact.  ?   Gait: Gait is intact.  ?Psychiatric:     ?   Attention and  Perception: Attention and perception normal.     ?   Mood and Affect: Mood and affect normal.     ?   Speech: Speech normal.     ?   Behavior: Behavior normal. Behavior is cooperative.     ?   Thought Content: Thought content normal.     ?   Cognition and Memory: Cognition and memory normal.     ?   Judgment: Judgment normal.  ? ? ?Assessment  ?Plan  ?Herpes zoster with ophthalmic complication, unspecified herpes zoster eye disease - Plan: Ambulatory referral to Ophthalmology, gabapentin (NEURONTIN) 100 MG capsule, valACYclovir (VALTREX) 1000 MG tablet ? ?Swollen face ? ?Herpes zoster ophthalmicus of right eye - Plan: gabapentin (NEURONTIN) 100 MG  capsule, valACYclovir (VALTREX) 1000 MG tablet  ?Prn benadryl for itching  ?Prn tylenol and ibuprofen  ? ?Provider: Dr. French Ana McLean-Scocuzza-Internal Medicine  ?

## 2021-10-11 MED ORDER — OXYCODONE-ACETAMINOPHEN 5-325 MG PO TABS: 1.0000 | ORAL_TABLET | Freq: Two times a day (BID) | ORAL | 0 refills | Status: AC | PRN

## 2021-10-11 NOTE — Telephone Encounter (Signed)
Patient seen 10/08/21 for facial pain and possible HSV.  ? ?Please advise  on pain medication.  ?

## 2021-10-11 NOTE — Addendum Note (Signed)
Addended by: Quentin Ore on: 10/11/2021 10:14 AM ? ? Modules accepted: Orders ? ?

## 2021-10-29 ENCOUNTER — Encounter: Payer: Self-pay | Admitting: Family

## 2021-10-29 ENCOUNTER — Ambulatory Visit (INDEPENDENT_AMBULATORY_CARE_PROVIDER_SITE_OTHER): Payer: BC Managed Care – PPO | Admitting: Family

## 2021-10-29 VITALS — BP 128/76 | HR 78 | Temp 98.0°F | Ht 68.0 in | Wt 183.8 lb

## 2021-10-29 DIAGNOSIS — K5900 Constipation, unspecified: Secondary | ICD-10-CM

## 2021-10-29 DIAGNOSIS — R635 Abnormal weight gain: Secondary | ICD-10-CM | POA: Diagnosis not present

## 2021-10-29 LAB — POCT GLYCOSYLATED HEMOGLOBIN (HGB A1C): Hemoglobin A1C: 4.9 % (ref 4.0–5.6)

## 2021-10-29 MED ORDER — BUPROPION HCL ER (XL) 150 MG PO TB24: 150.0000 mg | ORAL_TABLET | ORAL | 3 refills | Status: DC

## 2021-10-29 NOTE — Patient Instructions (Addendum)
If you are feel well on wellbutrin and after 2-3 weeks,want to increase, you may increase from 1 tablet in the morning to two tablets in the morning ( total of 300mg ).  ? ?We discussed today starting medication called Wellbutrin.  As also discussed, you must limit alcohol on this medication as alcohol and Wellbutrin together may increase your risk for seizure.  You may drink no more than 1 alcoholic beverage on this medication. ? A standard drink is 12 ounces of regular beer, which is usually about 5% alcohol ?OR ?5 ounces of wine, which is typically about 12% alcohol ?OR  ? 1.5 ounces of distilled spirits, which is about 40% alcohol ? ?I have also placed a referral to gastroenterology to further investigate constipation. ?Let know if you dont hear back within a week in regards to an appointment being scheduled.  ? ?

## 2021-10-29 NOTE — Progress Notes (Signed)
? ?Subjective:  ? ? Patient ID: Rachel Compton, female    DOB: 11-01-1990, 31 y.o.   MRN: [id number] ? ?CC: Rachel Compton is a 31 y.o. female who presents today for follow up.  ? ?HPI: Follow-up constipation.  Started MiraLAX using daily and she is having formed brown BM but notes that they are small. She tried metamucil without relief. Nonbloody. Having BM twice per week.  ?She is drinking a lot of water. She is not using sennakot ?No abdominal pain, fever. She didn't start pepcid ac and has been avoiding food triggers and epigastric pain has been well controlled.  ? ?She is interested in medication for weight loss.  In her diet, she avoids sugar.She has no family history of diabetes.  No history of seizure.  No history of anorexia or bulimia.  She drinks alcohol occasionally on the weekends ? ?Non smoker ?HISTORY:  ?Past Medical History:  ?Diagnosis Date  ? Asthma   ? Chicken pox   ? Migraine   ? 04/22/08 MRI normal   ? Stevens-Johnson syndrome (HCC)   ? b/l eyes surgery at [facility]  ? ?Past Surgical History:  ?Procedure Laterality Date  ? EYE SURGERY  03/2020  ? NO PAST SURGERIES    ? ?Family History  ?Adopted: Yes  ?Problem Relation Age of Onset  ? Cancer Neg Hx   ? Diabetes Neg Hx   ? Hyperlipidemia Neg Hx   ? Hypertension Neg Hx   ? Thyroid disease Neg Hx   ? ? ?Allergies: Penicillins ?Current Outpatient Medications on File Prior to Visit  ?Medication Sig Dispense Refill  ? gabapentin (NEURONTIN) 100 MG capsule Take 1 capsule (100 mg total) by mouth at bedtime. 30 capsule 1  ? Levonorgestrel-Ethinyl Estradiol (AMETHIA) 0.15-0.03 &0.01 MG tablet TAKE 1 TABLET BY MOUTH EVERYDAY AT BEDTIME 90 tablet 0  ? sulfamethoxazole-trimethoprim (BACTRIM DS) 800-160 MG tablet Take 1 tablet by mouth 2 (two) times daily. 10 tablet 0  ? valACYclovir (VALTREX) 1000 MG tablet Take 1 tablet (1,000 mg total) by mouth 3 (three) times daily. With food (Patient not taking: Reported on 10/28/2021) 42 tablet 0  ?  [DISCONTINUED] fexofenadine (ALLEGRA ALLERGY) 60 MG tablet Take 1 tablet (60 mg total) by mouth 2 (two) times daily. 120 tablet 1  ? ?No current facility-administered medications on file prior to visit.  ? ? ?Social History  ? ?Tobacco Use  ? Smoking status: Never  ? Smokeless tobacco: Never  ?Vaping Use  ? Vaping Use: Former  ?Substance Use Topics  ? Alcohol use: Yes  ?  Alcohol/week: 5.0 standard drinks  ?  Types: 5 Standard drinks or equivalent per week  ?  Comment: social  ? Drug use: No  ? ? ?Review of Systems  ?Constitutional:  Negative for chills and fever.  ?Respiratory:  Negative for cough.   ?Cardiovascular:  Negative for chest pain and palpitations.  ?Gastrointestinal:  Positive for constipation. Negative for abdominal distention, blood in stool, nausea and vomiting.  ?   ?Objective:  ?  ?BP 128/76 (BP Location: Left Arm, Patient Position: Sitting, Cuff Size: Normal)   Pulse 78   Temp 98 ?F (36.7 ?C) (Oral)   Ht 5\' 8"  (1.727 m)   Wt 183 lb 12.8 oz (83.4 kg)   LMP 10/13/2021 (Exact Date)   SpO2 98%   BMI 27.95 kg/m?  ?BP Readings from Last 3 Encounters:  ?10/29/21 128/76  ?10/08/21 106/72  ?09/15/21 98/64  ? ?Wt Readings from Last 3 Encounters:  ?  10/29/21 183 lb 12.8 oz (83.4 kg)  ?10/08/21 187 lb 2 oz (84.9 kg)  ?09/15/21 189 lb 9.6 oz (86 kg)  ? ? ?Physical Exam ?Vitals reviewed.  ?Constitutional:   ?   Appearance: She is well-developed.  ?Eyes:  ?   Conjunctiva/sclera: Conjunctivae normal.  ?Cardiovascular:  ?   Rate and Rhythm: Normal rate and regular rhythm.  ?   Pulses: Normal pulses.  ?   Heart sounds: Normal heart sounds.  ?Pulmonary:  ?   Effort: Pulmonary effort is normal.  ?   Breath sounds: Normal breath sounds. No wheezing, rhonchi or rales.  ?Skin: ?   General: Skin is warm and dry.  ?Neurological:  ?   Mental Status: She is alert.  ?Psychiatric:     ?   Speech: Speech normal.     ?   Behavior: Behavior normal.     ?   Thought Content: Thought content normal.  ? ? ?   ?Assessment &  Plan:  ? ?Problem List Items Addressed This Visit   ? ?  ? Other  ? Constipation  ?  Chronic. No relief with miralax daily, metamucil. Discussed if IBS-C however advised that prior to making that diagnosis, I would feel most comfortable if we arrange gastroenterology consult to ensure she does not need further diagnostics including colonoscopy.  She may be a candidate for Linzess.  Referral to gastroenterology.  We will follow ?  ?  ? Relevant Orders  ? Ambulatory referral to Gastroenterology  ? Weight gain - Primary  ?  A1c today 4.9.Discussed various options in regards to weight loss.  Advised against GLP-1 agonist in the setting of constipation as likely would exacerbate.  We discussed metformin.  She felt most comfortable with starting Wellbutrin.  Counseled her on the importance of no more than 1 alcoholic beverage in a sitting.Start Wellbutrin 150 mg and she may self titrate to 300 mg in a couple weeks if she feels necessary.  Counseled her on side effects including irritability, anxiety ?  ?  ? Relevant Medications  ? buPROPion (WELLBUTRIN XL) 150 MG 24 hr tablet  ? Other Relevant Orders  ? POCT HgB A1C (Completed)  ? ? ? ?I am having Rachel Compton start on buPROPion. I am also having her maintain her Levonorgestrel-Ethinyl Estradiol, sulfamethoxazole-trimethoprim, gabapentin, and valACYclovir. ? ? ?Meds ordered this encounter  ?Medications  ? buPROPion (WELLBUTRIN XL) 150 MG 24 hr tablet  ?  Sig: Take 1 tablet (150 mg total) by mouth every morning.  ?  Dispense:  90 tablet  ?  Refill:  3  ?  Order Specific Question:   Supervising Provider  ?  Answer:   Sherlene Shams [[id number]]  ? ? ?Return precautions given.  ? ?Risks, benefits, and alternatives of the medications and treatment plan prescribed today were discussed, and patient expressed understanding.  ? ?Education regarding symptom management and diagnosis given to patient on AVS. ? ?Continue to follow with [name], FNP for routine health  maintenance.  ? ?Rachel Compton and I agreed with plan.  ? ?[name], FNP ? ? ?

## 2021-10-29 NOTE — Assessment & Plan Note (Addendum)
A1c today 4.9.Discussed various options in regards to weight loss.  Advised against GLP-1 agonist in the setting of constipation as likely would exacerbate.  We discussed metformin.  She felt most comfortable with starting Wellbutrin.  Counseled her on the importance of no more than 1 alcoholic beverage in a sitting.Start Wellbutrin 150 mg and she may self titrate to 300 mg in a couple weeks if she feels necessary.  Counseled her on side effects including irritability, anxiety ?

## 2021-10-29 NOTE — Assessment & Plan Note (Signed)
Chronic. No relief with miralax daily, metamucil. Discussed if IBS-C however advised that prior to making that diagnosis, I would feel most comfortable if we arrange gastroenterology consult to ensure she does not need further diagnostics including colonoscopy.  She may be a candidate for Linzess.  Referral to gastroenterology.  We will follow ?

## 2021-11-01 ENCOUNTER — Telehealth: Payer: Self-pay

## 2021-11-01 NOTE — Telephone Encounter (Signed)
CALLED PATIENT NO ANSWER LEFT VOICEMAIL FOR A CALL BACK ? ?

## 2021-11-01 NOTE — Telephone Encounter (Signed)
Scheduled for in person visit  ?

## 2021-11-02 ENCOUNTER — Telehealth: Payer: Self-pay | Admitting: Family

## 2021-11-02 NOTE — Telephone Encounter (Signed)
Pt called in stating that she was referred to GI by NP Arnett... Pt stated that she spoke with someone from GI and they advise her that there next available appt is at the end of July... Pt is requesting for referral to be sent to GI in [place].. Pt requesting callback  ?

## 2021-11-08 ENCOUNTER — Telehealth: Payer: Self-pay

## 2021-11-08 ENCOUNTER — Other Ambulatory Visit: Payer: Self-pay

## 2021-11-08 ENCOUNTER — Telehealth: Payer: Self-pay | Admitting: Family

## 2021-11-08 DIAGNOSIS — K5900 Constipation, unspecified: Secondary | ICD-10-CM

## 2021-11-08 NOTE — Telephone Encounter (Signed)
LMTCB to office to let patient know that referral has been placed for [facility] ?

## 2021-11-08 NOTE — Telephone Encounter (Signed)
Lft pt vm to call ofc so I can give pt the number to call [place] gastro at [phone]. Thank you! ?

## 2021-11-08 NOTE — Telephone Encounter (Signed)
LMTCB to office to inform patient that referral has been sent to [facility] ?

## 2021-11-10 ENCOUNTER — Encounter: Payer: Self-pay | Admitting: Gastroenterology

## 2021-11-12 NOTE — Telephone Encounter (Signed)
Spoke to patient and she has already scheduled appointment with GI in [place] ?

## 2021-12-10 ENCOUNTER — Ambulatory Visit: Payer: BC Managed Care – PPO | Admitting: Family

## 2021-12-10 ENCOUNTER — Encounter: Payer: Self-pay | Admitting: Family

## 2021-12-10 VITALS — BP 130/74 | HR 98 | Temp 97.3°F | Ht 68.0 in | Wt 188.4 lb

## 2021-12-10 DIAGNOSIS — J019 Acute sinusitis, unspecified: Secondary | ICD-10-CM

## 2021-12-10 DIAGNOSIS — Z0000 Encounter for general adult medical examination without abnormal findings: Secondary | ICD-10-CM

## 2021-12-10 MED ORDER — DOXYCYCLINE HYCLATE 100 MG PO TABS: 100.0000 mg | ORAL_TABLET | Freq: Two times a day (BID) | ORAL | 0 refills | Status: DC

## 2021-12-10 NOTE — Progress Notes (Signed)
Was suppose to be physical but patient has sinus infection? Patient is stuffy and has chills , no fever but jittery. Took covid test yesterday and was negative. Stated that her and her husband went on cruise and since they returned both are sick? He tested negative for flu and covid on yesterday as well? Patient stated she is not taking Wellbutrin because it makes her jittery and anxious? ?

## 2021-12-10 NOTE — Patient Instructions (Signed)
Start doxycycline (antibiotic) ?This medication can make it easier to develop a sunburn so please avoid the sun while on this antibiotic ?Ensure to take probiotics while on antibiotics and also for 2 weeks after completion. This can either be by eating yogurt daily or taking a probiotic supplement over the counter such as Culturelle.It is important to re-colonize the gut with good bacteria and also to prevent any diarrheal infections associated with antibiotic use.  ? ?May use plain Mucinex with plenty water. ? ?Let me know if symptoms do not resolve ?

## 2021-12-10 NOTE — Progress Notes (Signed)
? ?Subjective:  ? ? Patient ID: Rachel Compton, female    DOB: [birth year], 31 y.o.   MRN: [id number] ? ?CC: Rachel Compton is a 31 y.o. female who presents today for an acute visit.   ? ?HPI: Acute visit for nasal congestion, sinus pain x4 days, unchanged.  Reports sneezing started 4 days ago.  endorses postnasal drip, chills.  Right-sided facial pain and teeth pain.  She recently went on a cruise to the [place].  Had recent dental cleaning without any evidence of new cavities.  Denies fever and cough, diarrhea.  COVID-negative at home yesterday.  She is taking Sudafed Benadryl and nasal saline spray.  Recent antibiotics with Bactrim treated 09/01/2021. ? ? ?HISTORY:  ?Past Medical History:  ?Diagnosis Date  ? Asthma   ? Chicken pox   ? Migraine   ? 04/22/08 MRI normal   ? Stevens-Johnson syndrome (HCC)   ? b/l eyes surgery at [facility]  ? ?Past Surgical History:  ?Procedure Laterality Date  ? EYE SURGERY  03/2020  ? NO PAST SURGERIES    ? ?Family History  ?Adopted: Yes  ?Problem Relation Age of Onset  ? Cancer Neg Hx   ? Diabetes Neg Hx   ? Hyperlipidemia Neg Hx   ? Hypertension Neg Hx   ? Thyroid disease Neg Hx   ? ? ?Allergies: Penicillins ?Current Outpatient Medications on File Prior to Visit  ?Medication Sig Dispense Refill  ? Levonorgestrel-Ethinyl Estradiol (AMETHIA) 0.15-0.03 &0.01 MG tablet TAKE 1 TABLET BY MOUTH EVERYDAY AT BEDTIME 90 tablet 0  ? gabapentin (NEURONTIN) 100 MG capsule Take 1 capsule (100 mg total) by mouth at bedtime. 30 capsule 1  ? valACYclovir (VALTREX) 1000 MG tablet Take 1 tablet (1,000 mg total) by mouth 3 (three) times daily. With food (Patient not taking: Reported on 10/28/2021) 42 tablet 0  ? [DISCONTINUED] fexofenadine (ALLEGRA ALLERGY) 60 MG tablet Take 1 tablet (60 mg total) by mouth 2 (two) times daily. 120 tablet 1  ? ?No current facility-administered medications on file prior to visit.  ? ? ?Social History  ? ?Tobacco Use  ? Smoking status: Never  ? Smokeless tobacco:  Never  ?Vaping Use  ? Vaping Use: Former  ?Substance Use Topics  ? Alcohol use: Yes  ?  Alcohol/week: 5.0 standard drinks  ?  Types: 5 Standard drinks or equivalent per week  ?  Comment: social  ? Drug use: No  ? ? ?Review of Systems  ?Constitutional:  Positive for chills. Negative for fever.  ?HENT:  Positive for congestion and sinus pain. Negative for dental problem.   ?Respiratory:  Negative for cough and shortness of breath.   ?Cardiovascular:  Positive for chest pain. Negative for palpitations.  ?Gastrointestinal:  Negative for nausea and vomiting.  ?   ?Objective:  ?  ?BP 130/74 (BP Location: Left Arm, Patient Position: Sitting, Cuff Size: Normal)   Pulse 98   Temp (!) 97.3 ?F (36.3 ?C) (Oral)   Ht 5\' 8"  (1.727 m)   Wt 188 lb 6.4 oz (85.5 kg)   LMP  (LMP Unknown)   SpO2 98%   BMI 28.65 kg/m?  ? ? ?Physical Exam ?Vitals reviewed.  ?Constitutional:   ?   Appearance: She is well-developed.  ?HENT:  ?   Head: Normocephalic and atraumatic.  ?   Right Ear: Hearing, tympanic membrane, ear canal and external ear normal. No decreased hearing noted. No drainage, swelling or tenderness. No middle ear effusion. No foreign body. Tympanic membrane  is not erythematous or bulging.  ?   Left Ear: Hearing, tympanic membrane, ear canal and external ear normal. No decreased hearing noted. No drainage, swelling or tenderness.  No middle ear effusion. No foreign body. Tympanic membrane is not erythematous or bulging.  ?   Nose: Nose normal. No rhinorrhea.  ?   Right Sinus: No maxillary sinus tenderness or frontal sinus tenderness.  ?   Left Sinus: No maxillary sinus tenderness or frontal sinus tenderness.  ?   Mouth/Throat:  ?   Pharynx: Uvula midline. No oropharyngeal exudate or posterior oropharyngeal erythema.  ?   Tonsils: No tonsillar abscesses.  ?   Comments: No gross signs of decay in dentition or gum swelling ?Eyes:  ?   Conjunctiva/sclera: Conjunctivae normal.  ?Cardiovascular:  ?   Rate and Rhythm: Regular rhythm.   ?   Pulses: Normal pulses.  ?   Heart sounds: Normal heart sounds.  ?Pulmonary:  ?   Effort: Pulmonary effort is normal.  ?   Breath sounds: Normal breath sounds. No wheezing, rhonchi or rales.  ?Lymphadenopathy:  ?   Head:  ?   Right side of head: No submental, submandibular, tonsillar, preauricular, posterior auricular or occipital adenopathy.  ?   Left side of head: No submental, submandibular, tonsillar, preauricular, posterior auricular or occipital adenopathy.  ?   Cervical: No cervical adenopathy.  ?Skin: ?   General: Skin is warm and dry.  ?Neurological:  ?   Mental Status: She is alert.  ?Psychiatric:     ?   Speech: Speech normal.     ?   Behavior: Behavior normal.     ?   Thought Content: Thought content normal.  ? ? ?   ?Assessment & Plan:  ? ? ?Problem List Items Addressed This Visit   ? ?  ? Respiratory  ? Sinusitis - Primary  ?  Presentation consistent with bacterial sinusitis.  Based on duration and unilateral facial pain today, agreed to start doxycycline.  Advise probiotics and to avoid the sun this medication can make it easier to develop a sunburn.  She will let me know how she is doing. ? ?  ?  ? Relevant Medications  ? doxycycline (VIBRA-TABS) 100 MG tablet  ?  ? Other  ? Annual physical exam  ? ? ? ?I have discontinued Linsey L. Boettcher's sulfamethoxazole-trimethoprim and buPROPion. I am also having her start on doxycycline. Additionally, I am having her maintain her Levonorgestrel-Ethinyl Estradiol, gabapentin, and valACYclovir. ? ? ?Meds ordered this encounter  ?Medications  ? doxycycline (VIBRA-TABS) 100 MG tablet  ?  Sig: Take 1 tablet (100 mg total) by mouth 2 (two) times daily.  ?  Dispense:  10 tablet  ?  Refill:  0  ?  Order Specific Question:   Supervising Provider  ?  Answer:   Sherlene Shams [[id number]]  ? ? ?Return precautions given.  ? ?Risks, benefits, and alternatives of the medications and treatment plan prescribed today were discussed, and patient expressed understanding.   ? ?Education regarding symptom management and diagnosis given to patient on AVS. ? ?Continue to follow with [name], FNP for routine health maintenance.  ? ?Rachel Compton and I agreed with plan.  ? ?[name], FNP ? ? ?

## 2021-12-10 NOTE — Assessment & Plan Note (Signed)
Presentation consistent with bacterial sinusitis.  Based on duration and unilateral facial pain today, agreed to start doxycycline.  Advise probiotics and to avoid the sun this medication can make it easier to develop a sunburn.  She will let me know how she is doing. ?

## 2021-12-14 ENCOUNTER — Encounter: Payer: Self-pay | Admitting: Gastroenterology

## 2021-12-14 ENCOUNTER — Ambulatory Visit (INDEPENDENT_AMBULATORY_CARE_PROVIDER_SITE_OTHER): Payer: BC Managed Care – PPO | Admitting: Gastroenterology

## 2021-12-14 VITALS — BP 110/70 | HR 93 | Ht 68.0 in | Wt 191.0 lb

## 2021-12-14 DIAGNOSIS — K5900 Constipation, unspecified: Secondary | ICD-10-CM

## 2021-12-14 MED ORDER — LINACLOTIDE 145 MCG PO CAPS: 145.0000 ug | ORAL_CAPSULE | Freq: Every day | ORAL | 3 refills | Status: DC

## 2021-12-14 NOTE — Progress Notes (Signed)
? ?HPI : Rachel Compton is a very pleasant 31 year old female with no significant chronic medical problems who is referred to [place] by Mable Paris, FNP for further management of constipation.  Patient reports she has been bothered by constipation for several years now, but it seems to be getting worse.   ?She is currently having a bowel movement only about once a week.  She has significant straining with bowel movements.  She often uses a suppository or fleets enema to have bowel movements.  Her stools are typically small in volume, but not usually hard or pellet-like.  Diarrhea has never applied for her.  She denies significant abdominal pain, just feelings of bloating and fullness.  No nausea or vomiting.  Her appetite is good.  No weight loss, rather she has gained about 15 pounds over 5 months.  No blood in the stool.  No recent changes in her diet or medications to account for the worsening of her constipation. ? ?She used to take senna at night regularly, and this would work very well for him, producing a bowel movement the next day.  Currently senna does nothing for her.  She tried taking MiraLAX every day for several weeks, but noticed no difference in her bowel habits.  She also says the same thing about Metamucil, both powder and the pills. ? ?She reports a history of 2 vaginal deliveries, both with minor tears requiring sutures, but no surgery required.  She denies any symptoms of fecal incontinence or seepage.  No urinary symptoms. ? ? ? ?Past Medical History:  ?Diagnosis Date  ? Asthma   ? Chicken pox   ? Migraine   ? 04/22/08 MRI normal   ? Stevens-Johnson syndrome (HCC)   ? b/l eyes surgery at [place]  ? ? ? ?Past Surgical History:  ?Procedure Laterality Date  ? EYE SURGERY  03/2020  ? NO PAST SURGERIES    ? ?Family History  ?Adopted: Yes  ?Problem Relation Age of Onset  ? Cancer Neg Hx   ? Diabetes Neg Hx   ? Hyperlipidemia Neg Hx   ? Hypertension Neg Hx   ? Thyroid disease Neg Hx   ? ?Social History   ? ?Tobacco Use  ? Smoking status: Never  ? Smokeless tobacco: Never  ?Vaping Use  ? Vaping Use: Former  ?Substance Use Topics  ? Alcohol use: Yes  ?  Alcohol/week: 5.0 standard drinks  ?  Types: 5 Standard drinks or equivalent per week  ?  Comment: social  ? Drug use: No  ? ?Current Outpatient Medications  ?Medication Sig Dispense Refill  ? doxycycline (VIBRA-TABS) 100 MG tablet Take 1 tablet (100 mg total) by mouth 2 (two) times daily. 10 tablet 0  ? Levonorgestrel-Ethinyl Estradiol (AMETHIA) 0.15-0.03 &0.01 MG tablet TAKE 1 TABLET BY MOUTH EVERYDAY AT BEDTIME 90 tablet 0  ? ?No current facility-administered medications for this visit.  ? ?Allergies  ?Allergen Reactions  ? Penicillins Hives  ? ? ? ?Review of Systems: ?All systems reviewed and negative except where noted in HPI.  ? ? ?No results found. ? ?Physical Exam: ?BP 110/70   Pulse 93   Ht 5\' 8"  (1.727 m)   Wt 191 lb (86.6 kg)   LMP  (LMP Unknown)   BMI 29.04 kg/m?  ?Constitutional: Pleasant,well-developed, African-American female in no acute distress. ?HEENT: Normocephalic and atraumatic. Conjunctivae are normal. No scleral icterus. ?Neck supple.  ?Cardiovascular: Normal rate, regular rhythm.  ?Pulmonary/chest: Effort normal and breath sounds normal. No wheezing,  rales or rhonchi. ?Abdominal: Soft, nondistended, nontender. Bowel sounds active throughout. There are no masses palpable. No hepatomegaly. ?Extremities: no edema ?Neurological: Alert and oriented to person place and time. ?Skin: Skin is warm and dry. No rashes noted. ?Psychiatric: Normal mood and affect. Behavior is normal. ? ?CBC ?   ?Component Value Date/Time  ? WBC 6.2 06/17/2020 1122  ? WBC 7.5 04/30/2020 1500  ? RBC 4.28 06/17/2020 1122  ? RBC 3.96 04/30/2020 1500  ? HGB 13.3 06/17/2020 1122  ? HCT 37.8 06/17/2020 1122  ? PLT 303.0 04/30/2020 1500  ? MCV 88 06/17/2020 1122  ? MCH 31.1 06/17/2020 1122  ? MCH 29.0 03/14/2020 0323  ? MCHC 35.2 06/17/2020 1122  ? MCHC 33.4 04/30/2020 1500  ?  RDW 12.5 06/17/2020 1122  ? LYMPHSABS 2.1 06/17/2020 1122  ? MONOABS 0.5 04/30/2020 1500  ? EOSABS 0.5 (H) 06/17/2020 1122  ? BASOSABS 0.1 06/17/2020 1122  ? ? ?CMP  ?   ?Component Value Date/Time  ? NA 139 03/25/2020 1439  ? K 3.6 03/25/2020 1439  ? CL 99 03/25/2020 1439  ? CO2 30 03/25/2020 1439  ? GLUCOSE 116 (H) 03/25/2020 1439  ? BUN 10 03/25/2020 1439  ? CREATININE 0.88 03/25/2020 1439  ? CALCIUM 9.5 03/25/2020 1439  ? PROT 6.9 03/25/2020 1439  ? ALBUMIN 3.9 03/25/2020 1439  ? AST 14 03/25/2020 1439  ? ALT 34 03/25/2020 1439  ? ALKPHOS 56 03/25/2020 1439  ? BILITOT 0.4 03/25/2020 1439  ? GFRNONAA >60 03/14/2020 0323  ? GFRAA >60 03/14/2020 0323  ? ?Component Ref Range & Units 2 mo ago 2 yr ago  ?Antigliadin Abs, IgA 0 - 19 units 3  2 CM   ?Comment:                    Negative                   0 - 19  ?                   Weak Positive             20 - 30  ?                   Moderate to Strong Positive   >30   ?Transglutaminase IgA 0 - 3 U/mL <2  <2 CM   ?Comment:                               Negative        0 -  3  ?                              Weak Positive   4 - 10  ?                              Positive           >10  ? Tissue Transglutaminase (tTG) has been identified  ? as the endomysial antigen.  Studies have demonstr-  ? ated that endomysial IgA antibodies have over 99%  ? specificity for gluten sensitive enteropathy.   ?IgA/Immunoglobulin A, Serum 87 - 352 mg/dL 82 Low   85 Low    ?Resulting Agency  [facility]   ? ?Component Ref  Range & Units 2 mo ago  ?H. pylori Breath Test NOT DETECTED NOT DETECTED   ? ? ?ASSESSMENT AND PLAN: ?31 year old female with chronic constipation, which is worsened over the past few years, now with only 1 bowel movement per week on average.  She has failed stimulant laxatives, bulk laxatives and osmotic laxatives.  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  We will place referral for pelvic floor physical therapy.  We will start Linzess in the meantime.  If her symptoms are greatly improved with Linzess and she is happy with this therapy, she does not need to proceed with pelvic floor physical therapy.  She was advised, however that pelvic floor physical therapy may obviate the need to take laxatives to help her have bowel movements. ? ?Constipation ?- Linzess 145 mcg p.o. daily ?- Pelvic floor PT ? ?Ilina Xu E. Candis Schatz, MD ?[facility] ? ? ?CC:  Burnard Hawthorne, FNP ? ?

## 2021-12-14 NOTE — Patient Instructions (Addendum)
If you are age 33 or older, your body mass index should be between 23-30. Your Body mass index is 29.04 kg/m?Marland Kitchen If this is out of the aforementioned range listed, please consider follow up with your Primary Care Provider. ? ?If you are age 85 or younger, your body mass index should be between 19-25. Your Body mass index is 29.04 kg/m?Marland Kitchen If this is out of the aformentioned range listed, please consider follow up with your Primary Care Provider.  ? ?We have sent a referral to Physical therapy and they will contact you to make an appointment. ? ?We have sent the following medications to your pharmacy for you to pick up at your convenience: Linzess 145 mcg daily. ? ? ?________________________________________________________ ? ?The [facility] providers would like to encourage you to use [facility] to communicate with providers for non-urgent requests or questions.  Due to long hold times on the telephone, sending your provider a message by [facility] may be a faster and more efficient way to get a response.  Please allow 48 business hours for a response.  Please remember that this is for non-urgent requests.  ?_______________________________________________________ ? ?It was a pleasure to see you today! ? ?Thank you for trusting me with your gastrointestinal care!   ? ?Scott E.Candis Schatz, MD  ? ?

## 2022-01-06 ENCOUNTER — Encounter: Payer: BC Managed Care – PPO | Admitting: Family

## 2022-01-31 ENCOUNTER — Telehealth: Payer: BC Managed Care – PPO | Admitting: Physician Assistant

## 2022-01-31 DIAGNOSIS — R3989 Other symptoms and signs involving the genitourinary system: Secondary | ICD-10-CM

## 2022-01-31 MED ORDER — SULFAMETHOXAZOLE-TRIMETHOPRIM 800-160 MG PO TABS: 1.0000 | ORAL_TABLET | Freq: Two times a day (BID) | ORAL | 0 refills | Status: DC

## 2022-01-31 NOTE — Progress Notes (Signed)

## 2022-02-04 ENCOUNTER — Other Ambulatory Visit: Payer: Self-pay | Admitting: Obstetrics and Gynecology

## 2022-02-04 DIAGNOSIS — N921 Excessive and frequent menstruation with irregular cycle: Secondary | ICD-10-CM

## 2022-02-17 ENCOUNTER — Ambulatory Visit: Payer: BC Managed Care – PPO | Admitting: Gastroenterology

## 2022-08-24 ENCOUNTER — Ambulatory Visit: Payer: BC Managed Care – PPO | Admitting: Family

## 2022-08-26 ENCOUNTER — Ambulatory Visit (INDEPENDENT_AMBULATORY_CARE_PROVIDER_SITE_OTHER): Payer: BC Managed Care – PPO | Admitting: Family

## 2022-08-26 ENCOUNTER — Encounter: Payer: Self-pay | Admitting: Family

## 2022-08-26 VITALS — BP 128/72 | HR 83 | Temp 98.0°F | Ht 68.0 in | Wt 193.4 lb

## 2022-08-26 DIAGNOSIS — N939 Abnormal uterine and vaginal bleeding, unspecified: Secondary | ICD-10-CM | POA: Diagnosis not present

## 2022-08-26 MED ORDER — NORETHINDRONE ACETATE 5 MG PO TABS: ORAL_TABLET | ORAL | 1 refills | Status: DC

## 2022-08-26 NOTE — Progress Notes (Unsigned)
   Assessment & Plan:  There are no diagnoses linked to this encounter.   Return precautions given.   Risks, benefits, and alternatives of the medications and treatment plan prescribed today were discussed, and patient expressed understanding.   Education regarding symptom management and diagnosis given to patient on AVS either electronically or printed.  No follow-ups on file.  Mable Paris, FNP  Subjective:    Patient ID: Rachel Compton, female    DOB: 1991-02-25, 32 y.o.   MRN: [id number]  CC: Rachel Compton is a 32 y.o. female who presents today for an acute visit.    HPI: Complains of vaginal bleeding x 3 weeks.  Started with little scant dark blood and increased to tampon this past week, changing tampon once daily. No large clots.  No pelvic pain.  She typically cycles for 90 days, then has a menses.   No rectal bleeding, hematuria, hemorrhoids.      Previous history of heavy bleeding, 04/2021.  Anticoagulation evaluation normal at that time 09/2017 Pap smear NIL  Allergies: Penicillins Current Outpatient Medications on File Prior to Visit  Medication Sig Dispense Refill   Levonorgestrel-Ethinyl Estradiol (AMETHIA) 0.15-0.03 &0.01 MG tablet TAKE 1 TABLET BY MOUTH EVERYDAY AT BEDTIME 90 tablet 0   linaclotide (LINZESS) 145 MCG CAPS capsule Take 1 capsule (145 mcg total) by mouth daily before breakfast. 90 capsule 3   sulfamethoxazole-trimethoprim (BACTRIM DS) 800-160 MG tablet Take 1 tablet by mouth 2 (two) times daily. (Patient not taking: Reported on 08/26/2022) 10 tablet 0   [DISCONTINUED] fexofenadine (ALLEGRA ALLERGY) 60 MG tablet Take 1 tablet (60 mg total) by mouth 2 (two) times daily. 120 tablet 1   No current facility-administered medications on file prior to visit.    Review of Systems    Objective:    BP 128/72   Pulse 83   Temp 98 F (36.7 C) (Oral)   Ht 5\' 8"  (1.727 m)   Wt 193 lb 6.4 oz (87.7 kg)   LMP  (LMP Unknown)   SpO2 98%    BMI 29.41 kg/m   BP Readings from Last 3 Encounters:  08/26/22 128/72  12/14/21 110/70  12/10/21 130/74   Wt Readings from Last 3 Encounters:  08/26/22 193 lb 6.4 oz (87.7 kg)  12/14/21 191 lb (86.6 kg)  12/10/21 188 lb 6.4 oz (85.5 kg)    Physical Exam

## 2022-08-26 NOTE — Patient Instructions (Signed)
I have ordered transvaginal ultrasound Let us know if you dont hear back within a week in regards to an appointment being scheduled.   So that you are aware, if you are [facility] MyChart user , please pay attention to your MyChart messages as you may receive a MyChart message with a phone number to call and schedule this test/appointment own your own from our referral coordinator. This is a new process so I do not want you to miss this message.  If you are not a MyChart user, you will receive a phone call.    Trial of Aygestin.   Abnormal Uterine Bleeding Abnormal uterine bleeding means bleeding more than normal from your womb (uterus). It can include: Bleeding after sex. Bleeding between monthly (menstrual) periods. Bleeding that is heavier than normal. Monthly periods that last longer than normal. Bleeding after you have stopped having your monthly period (menopause). You should see a doctor for any kind of bleeding that is not normal. Treatment depends on the cause of your bleeding and how much you bleed. Follow these instructions at home: Medicines Take over-the-counter and prescription medicines only as told by your doctor. Ask your doctor about: Taking medicines such as aspirin and ibuprofen. Do not take these medicines unless your doctor tells you to take them. Taking over-the-counter medicines, vitamins, herbs, and supplements. You may be given iron pills. Take them as told by your doctor. Managing constipation If you take iron pills, you may need to take these actions to prevent or treat trouble pooping (constipation): Drink enough fluid to keep your pee (urine) pale yellow. Take over-the-counter or prescription medicines. Eat foods that are high in fiber. These include beans, whole grains, and fresh fruits and vegetables. Limit foods that are high in fat and sugar. These include fried or sweet foods. Activity Change your activity to decrease bleeding if you need to change your  sanitary pad more than one time every 2 hours: Lie in bed with your feet raised (elevated). Place a cold pack on your lower belly. Rest as much as you are able until the bleeding stops or slows down. General instructions Do not use tampons, douche, or have sex until your doctor says these things are okay. Change your pads often. Get regular exams. These include: Pelvic exams. Screenings for cancer of the cervix. It is up to you to get the results of any tests that are done. Ask how to get your results when they are ready. Watch for any changes in your bleeding. For 2 months, write down: When your monthly period starts. When your monthly period ends. When you get any abnormal bleeding from your vagina. What problems you notice. Keep all follow-up visits. Contact a doctor if: The bleeding lasts more than one week. You feel dizzy at times. You feel like you may vomit (nausea). You vomit. You feel light-headed or weak. Your symptoms get worse. Get help right away if: You faint. You have to change pads every hour. You have pain in your belly. You have a fever or chills. You get sweaty or weak. You pass large blood clots from your vagina. These symptoms may be an emergency. Get help right away. Call your local emergency services (911 in the [place] Do not wait to see if the symptoms will go away. Do not drive yourself to the hospital. Summary Abnormal uterine bleeding means bleeding more than normal from your womb (uterus). Any kind of bleeding that is not normal should be checked by a doctor. Treatment  depends on the cause of your bleeding and how much you bleed. Get help right away if you faint, you have to change pads every hour, or you pass large blood clots from your vagina. This information is not intended to replace advice given to you by your health care provider. Make sure you discuss any questions you have with your health care provider. Document Revised: 11/17/2020  Document Reviewed: 11/17/2020 Elsevier Patient Education  [place].

## 2022-08-27 LAB — TSH: TSH: 0.81 mIU/L

## 2022-08-29 NOTE — Assessment & Plan Note (Signed)
Stable in appearance. Consulted with Dr Amalia Hailey, GYN, via staff message whom advised to acutely stop bleeding Aygestin 5mg  BID to TID until bleeding stops. Consider Provera 2.5mg  10-14 days to control  breakthrough bleeding on OCPs in the future.  I spoke with patient and we opted to start with Aygestin. She will let me know how she is doing and if bleeding continues.

## 2022-09-06 ENCOUNTER — Ambulatory Visit: Payer: BC Managed Care – PPO

## 2022-09-30 ENCOUNTER — Ambulatory Visit (INDEPENDENT_AMBULATORY_CARE_PROVIDER_SITE_OTHER): Payer: BC Managed Care – PPO | Admitting: Obstetrics and Gynecology

## 2022-09-30 ENCOUNTER — Encounter: Payer: Self-pay | Admitting: Obstetrics and Gynecology

## 2022-09-30 VITALS — BP 111/76 | HR 78 | Ht 68.0 in | Wt 193.3 lb

## 2022-09-30 DIAGNOSIS — N921 Excessive and frequent menstruation with irregular cycle: Secondary | ICD-10-CM

## 2022-09-30 MED ORDER — TRANEXAMIC ACID 650 MG PO TABS: 1300.0000 mg | ORAL_TABLET | Freq: Two times a day (BID) | ORAL | 0 refills | Status: DC

## 2022-09-30 MED ORDER — DESOGESTREL-ETHINYL ESTRADIOL 0.15-0.02/0.01 MG (21/5) PO TABS: 1.0000 | ORAL_TABLET | Freq: Every day | ORAL | 1 refills | Status: DC

## 2022-09-30 NOTE — Progress Notes (Signed)
HPI:      [name] is a 32 y.o. [phone] who LMP was No LMP recorded. (Menstrual status: Oral contraceptives).  Subjective:   She presents today because she has had breakthrough bleeding on Seasonique OCPs.  She was on them for 2 years and doing well with only occasional spotting as breakthrough bleeding.  In her last pack she has noted that she began bleeding heavily in the middle of her pack.  Her PCP placed her on Aygestin which stopped her bleeding.  She then stopped the Aygestin and bled again heavily.  She restarted Aygestin and has now again stopped the Aygestin and is bleeding heavily.  She would really like the bleeding to stop.  She also states that she does not want to go on 58-monthbirth control pills.  She would like to go back to a monthly cyclic pill.    Hx: The following portions of the patient's history were reviewed and updated as appropriate:             She  has a past medical history of Asthma, Chicken pox, Migraine, and Stevens-Johnson syndrome ([place]. She does not have any pertinent problems on file. She  has a past surgical history that includes No past surgeries and Eye surgery (03/2020). Her family history is not on file. She was adopted. She  reports that she has never smoked. She has never used smokeless tobacco. She reports current alcohol use of about 5.0 standard drinks of alcohol per week. She reports that she does not use drugs. She has a current medication list which includes the following prescription(s): levonorgestrel-ethinyl estradiol, linaclotide, norethindrone, and [DISCONTINUED] fexofenadine. She is allergic to penicillins.       Review of Systems:  Review of Systems  Constitutional: Denied constitutional symptoms, night sweats, recent illness, fatigue, fever, insomnia and weight loss.  Eyes: Denied eye symptoms, eye pain, photophobia, vision change and visual disturbance.  Ears/Nose/Throat/Neck: Denied ear, nose, throat or neck symptoms,  hearing loss, nasal discharge, sinus congestion and sore throat.  Cardiovascular: Denied cardiovascular symptoms, arrhythmia, chest pain/pressure, edema, exercise intolerance, orthopnea and palpitations.  Respiratory: Denied pulmonary symptoms, asthma, pleuritic pain, productive sputum, cough, dyspnea and wheezing.  Gastrointestinal: Denied, gastro-esophageal reflux, melena, nausea and vomiting.  Genitourinary: See HPI for additional information.  Musculoskeletal: Denied musculoskeletal symptoms, stiffness, swelling, muscle weakness and myalgia.  Dermatologic: Denied dermatology symptoms, rash and scar.  Neurologic: Denied neurology symptoms, dizziness, headache, neck pain and syncope.  Psychiatric: Denied psychiatric symptoms, anxiety and depression.  Endocrine: Denied endocrine symptoms including hot flashes and night sweats.   Meds:   Current Outpatient Medications on File Prior to Visit  Medication Sig Dispense Refill   Levonorgestrel-Ethinyl Estradiol (AMETHIA) 0.15-0.03 &0.01 MG tablet TAKE 1 TABLET BY MOUTH EVERYDAY AT BEDTIME 90 tablet 0   linaclotide (LINZESS) 145 MCG CAPS capsule Take 1 capsule (145 mcg total) by mouth daily before breakfast. 90 capsule 3   norethindrone (AYGESTIN) 5 MG tablet Take 1 tablet (5 mg total) by mouth 2-3 times per day until vaginal bleeding stops. 30 tablet 1   [DISCONTINUED] fexofenadine (ALLEGRA ALLERGY) 60 MG tablet Take 1 tablet (60 mg total) by mouth 2 (two) times daily. 120 tablet 1   No current facility-administered medications on file prior to visit.      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   Anxiety 03/29/2021   Right knee pain 01/15/2021   UTI (urinary tract  infection) 08/05/2020   Abnormal vaginal bleeding 06/17/2020   Vaginal itching 05/11/2020   Migraines 03/25/2020   Membranous conjunctivitis 03/22/2020   Blistering rash 03/15/2020   Stevens-Johnson syndrome ([place]) 03/12/2020   Breast lump in upper outer quadrant 09/27/2019   Acute otalgia, right 08/27/2019   Cerumen impaction 08/27/2019   Constipation 02/22/2019   Lymphadenopathy 04/16/2018   Other fatigue 02/09/2018   Enlarged thyroid 02/09/2018   Right ankle pain 11/15/2017   Allergic rhinitis 11/15/2017   Boil 10/04/2017   Acute nonintractable headache 02/23/2017   Bloating 02/23/2017   Sinusitis 12/28/2016   Annual physical exam 06/09/2016   Active labor at term 04/18/2013   Other specified antenatal screening(V28.89) 03/12/2013   Anemia 02/12/2013   Polydactyly, fetal, affecting care of mother, antepartum 01/11/2013     1. Breakthrough bleeding on OCPs     Patient low risk for endometrial cancer based on previous ultrasound, age, and use of continuous OCPs.  Low risk of fibroids because she had an ultrasound within the last 3 years and none were noted.   Plan:            1.  Aygestin and TXA to manage current bleeding for 2 weeks  2.  Begin Mircette after discontinuation of TXA and Aygestin Orders No orders of the defined types were placed in this encounter.   No orders of the defined types were placed in this encounter.     F/U  No follow-ups on file. I spent 31 minutes involved in the care of this patient preparing to see the patient by obtaining and reviewing her medical history (including labs, imaging tests and prior procedures), documenting clinical information in the electronic health record (EHR), counseling and coordinating care plans, writing and sending prescriptions, ordering tests or procedures and in direct communicating with the patient and medical staff discussing pertinent items from her history and physical exam.  Finis Bud,  M.D. 09/30/2022 11:09 AM

## 2022-09-30 NOTE — Progress Notes (Signed)
Patient presents today to discuss breakthrough bleeding while using OCP's. She states bleeding has lasted for approximately two months and is bright red and heavy. No additional concerns.

## 2022-10-11 ENCOUNTER — Encounter: Payer: BC Managed Care – PPO | Admitting: Nurse Practitioner

## 2023-01-09 ENCOUNTER — Other Ambulatory Visit: Payer: Self-pay | Admitting: Gastroenterology

## 2023-03-31 ENCOUNTER — Other Ambulatory Visit: Payer: Self-pay | Admitting: Obstetrics and Gynecology

## 2023-03-31 DIAGNOSIS — N921 Excessive and frequent menstruation with irregular cycle: Secondary | ICD-10-CM

## 2023-06-01 ENCOUNTER — Encounter: Payer: BC Managed Care – PPO | Admitting: Family

## 2023-07-12 ENCOUNTER — Ambulatory Visit: Payer: BC Managed Care – PPO | Admitting: Family

## 2023-07-12 ENCOUNTER — Encounter: Payer: Self-pay | Admitting: Family

## 2023-07-12 VITALS — BP 128/82 | HR 82 | Temp 97.9°F | Ht 68.0 in | Wt 186.6 lb

## 2023-07-12 DIAGNOSIS — L02212 Cutaneous abscess of back [any part, except buttock]: Secondary | ICD-10-CM

## 2023-07-12 DIAGNOSIS — J309 Allergic rhinitis, unspecified: Secondary | ICD-10-CM

## 2023-07-12 MED ORDER — LEVOCETIRIZINE DIHYDROCHLORIDE 5 MG PO TABS: 5.0000 mg | ORAL_TABLET | Freq: Every evening | ORAL | 0 refills | Status: DC

## 2023-07-12 MED ORDER — DOXYCYCLINE HYCLATE 100 MG PO TABS: 100.0000 mg | ORAL_TABLET | Freq: Two times a day (BID) | ORAL | 0 refills | Status: AC

## 2023-07-12 MED ORDER — AZELASTINE HCL 0.1 % NA SOLN: 1.0000 | Freq: Two times a day (BID) | NASAL | 4 refills | Status: AC

## 2023-07-12 NOTE — Patient Instructions (Signed)
You may stop Claritin.  Instead trial Xyzal.  You may also use azelastine nasal spray which is also an antihistamine May consider allergy consult or retrial of Singulair Start doxycycline for boil on back.  You may also use warm compresses.  Please let me know how you are doing

## 2023-07-12 NOTE — Progress Notes (Unsigned)
Assessment & Plan:  There are no diagnoses linked to this encounter.   Return precautions given.   Risks, benefits, and alternatives of the medications and treatment plan prescribed today were discussed, and patient expressed understanding.   Education regarding symptom management and diagnosis given to patient on AVS either electronically or printed.  No follow-ups on file.  [name], FNP  Subjective:    Patient ID: Rachel Compton, female    DOB: 1990-12-10, 32 y.o.   MRN: [id number]  CC: Rachel Compton is a 32 y.o. female who presents today for an acute visit.    HPI: Complains of sneezing x 2 weeks  Endorses nasal congestion with clear congestion  No fever, sob,cough, ear pain, sore throat.     She tried claritin and dayquil without relief.     Allergies more bothersome when weather is changing  Dog at home  Previously on Singulair,flonase,  Allegra twice daily  History of Stevens-Johnsons   Allergies: Penicillins Current Outpatient Medications on File Prior to Visit  Medication Sig Dispense Refill   KARIVA 0.15-0.02/0.01 MG (21/5) tablet TAKE 1 TABLET BY MOUTH EVERYDAY AT BEDTIME 84 tablet 1   Levonorgestrel-Ethinyl Estradiol (AMETHIA) 0.15-0.03 &0.01 MG tablet TAKE 1 TABLET BY MOUTH EVERYDAY AT BEDTIME 90 tablet 0   linaclotide (LINZESS) 145 MCG CAPS capsule TAKE 1 CAPSULE BY MOUTH DAILY BEFORE BREAKFAST. 90 capsule 1   norethindrone (AYGESTIN) 5 MG tablet Take 1 tablet (5 mg total) by mouth 2-3 times per day until vaginal bleeding stops. (Patient not taking: Reported on 07/12/2023) 30 tablet 1   tranexamic acid (LYSTEDA) 650 MG TABS tablet Take 2 tablets (1,300 mg total) by mouth in the morning and at bedtime. Take during menses for a maximum of five days (Patient not taking: Reported on 07/12/2023) 30 tablet 0   [DISCONTINUED] fexofenadine (ALLEGRA ALLERGY) 60 MG tablet Take 1 tablet (60 mg total) by mouth 2 (two) times daily. 120 tablet  1   No current facility-administered medications on file prior to visit.    Review of Systems    Objective:    BP 128/82   Pulse 82   Temp 97.9 F (36.6 C) (Oral)   Ht 5\' 8"  (1.727 m)   Wt 186 lb 9.6 oz (84.6 kg)   LMP  (LMP Unknown)   SpO2 98%   BMI 28.37 kg/m   BP Readings from Last 3 Encounters:  07/12/23 128/82  09/30/22 111/76  08/26/22 128/72   Wt Readings from Last 3 Encounters:  07/12/23 186 lb 9.6 oz (84.6 kg)  09/30/22 193 lb 4.8 oz (87.7 kg)  08/26/22 193 lb 6.4 oz (87.7 kg)    Physical Exam Vitals reviewed.  Constitutional:      Appearance: She is well-developed.  HENT:     Head: Normocephalic and atraumatic.     Right Ear: Hearing, tympanic membrane, ear canal and external ear normal. No decreased hearing noted. No drainage, swelling or tenderness. No middle ear effusion. No foreign body. Tympanic membrane is not erythematous or bulging.     Left Ear: Hearing, tympanic membrane, ear canal and external ear normal. No decreased hearing noted. No drainage, swelling or tenderness.  No middle ear effusion. No foreign body. Tympanic membrane is not erythematous or bulging.     Nose: Nose normal. No rhinorrhea.     Right Sinus: No maxillary sinus tenderness or frontal sinus tenderness.     Left Sinus: No maxillary sinus tenderness or frontal sinus tenderness.  Mouth/Throat:     Pharynx: Uvula midline. No oropharyngeal exudate or posterior oropharyngeal erythema.     Tonsils: No tonsillar abscesses.  Eyes:     Conjunctiva/sclera: Conjunctivae normal.  Cardiovascular:     Rate and Rhythm: Normal rate and regular rhythm.     Pulses: Normal pulses.     Heart sounds: Normal heart sounds.  Pulmonary:     Effort: Pulmonary effort is normal.     Breath sounds: Normal breath sounds. No wheezing, rhonchi or rales.  Lymphadenopathy:     Head:     Right side of head: No submental, submandibular, tonsillar, preauricular, posterior auricular or occipital adenopathy.      Left side of head: No submental, submandibular, tonsillar, preauricular, posterior auricular or occipital adenopathy.     Cervical: No cervical adenopathy.  Skin:    General: Skin is warm and dry.          Comments: 2 m nonfluctuant tender abscess.  No purulent discharge, erythema or red streaks  Neurological:     Mental Status: She is alert.  Psychiatric:        Speech: Speech normal.        Behavior: Behavior normal.        Thought Content: Thought content normal.

## 2023-07-13 NOTE — Assessment & Plan Note (Addendum)
Reassuring exam.  Advised to trial azelastine and Xyzal instead of Claritin to see if more helpful for allergies.  If symptoms persist, consider Singulair, consult with allergy

## 2023-07-13 NOTE — Assessment & Plan Note (Signed)
Abscess is not fluctuant.  I did not perform incision and drainage.  Start doxycycline.  Patient will let me know if does not completely resolve.

## 2023-09-16 ENCOUNTER — Other Ambulatory Visit: Payer: Self-pay | Admitting: Obstetrics and Gynecology

## 2023-09-16 DIAGNOSIS — N921 Excessive and frequent menstruation with irregular cycle: Secondary | ICD-10-CM

## 2023-09-18 ENCOUNTER — Ambulatory Visit (INDEPENDENT_AMBULATORY_CARE_PROVIDER_SITE_OTHER): Payer: 59 | Admitting: Family

## 2023-09-18 VITALS — BP 128/84 | HR 84 | Temp 98.0°F | Ht 68.0 in | Wt 185.4 lb

## 2023-09-18 DIAGNOSIS — L511 Stevens-Johnson syndrome: Secondary | ICD-10-CM

## 2023-09-18 DIAGNOSIS — J309 Allergic rhinitis, unspecified: Secondary | ICD-10-CM | POA: Diagnosis not present

## 2023-09-18 DIAGNOSIS — H65112 Acute and subacute allergic otitis media (mucoid) (sanguinous) (serous), left ear: Secondary | ICD-10-CM

## 2023-09-18 DIAGNOSIS — H6690 Otitis media, unspecified, unspecified ear: Secondary | ICD-10-CM | POA: Insufficient documentation

## 2023-09-18 MED ORDER — PREDNISONE 10 MG PO TABS: ORAL_TABLET | ORAL | 0 refills | Status: DC

## 2023-09-18 MED ORDER — LEVOCETIRIZINE DIHYDROCHLORIDE 5 MG PO TABS: 5.0000 mg | ORAL_TABLET | Freq: Every evening | ORAL | 3 refills | Status: AC

## 2023-09-18 NOTE — Progress Notes (Unsigned)
Assessment & Plan:  There are no diagnoses linked to this encounter.   Return precautions given.   Risks, benefits, and alternatives of the medications and treatment plan prescribed today were discussed, and patient expressed understanding.   Education regarding symptom management and diagnosis given to patient on AVS either electronically or printed.  No follow-ups on file.  [name], FNP  Subjective:    Patient ID: Rachel Compton, female    DOB: [birth year], 33 y.o.   MRN: [id number]  CC: Rabia Argote is a 33 y.o. female who presents today for an acute visit.    HPI: Complains of left ear pain x 5 days ago, waxes and wanes.  Hearing is muffled Pain with sneezing, on occasion chewing.   She doesn't grind her teeth.   UTD teeth cleaning. No teeth sensitivity   No fever, sob, cough, ear discharge.   Compliant with azelastine; she has run out of Xyzal      Allergies: Penicillins Current Outpatient Medications on File Prior to Visit  Medication Sig Dispense Refill   azelastine (ASTELIN) 0.1 % nasal spray Place 1 spray into both nostrils 2 (two) times daily. Use in each nostril as directed 30 mL 4   desogestrel-ethinyl estradiol (KARIVA) 0.15-0.02/0.01 MG (21/5) tablet TAKE 1 TABLET BY MOUTH EVERYDAY AT BEDTIME 84 tablet 0   levocetirizine (XYZAL) 5 MG tablet Take 1 tablet (5 mg total) by mouth every evening. 90 tablet 0   linaclotide (LINZESS) 145 MCG CAPS capsule TAKE 1 CAPSULE BY MOUTH DAILY BEFORE BREAKFAST. 90 capsule 1   [DISCONTINUED] fexofenadine (ALLEGRA ALLERGY) 60 MG tablet Take 1 tablet (60 mg total) by mouth 2 (two) times daily. 120 tablet 1   No current facility-administered medications on file prior to visit.    Review of Systems  Constitutional:  Negative for chills and fever.  HENT:  Positive for ear pain. Negative for congestion, ear discharge, sore throat, trouble swallowing and voice change.   Respiratory:  Negative for  cough, shortness of breath and wheezing.   Cardiovascular:  Negative for chest pain and palpitations.  Gastrointestinal:  Negative for nausea and vomiting.      Objective:    BP 128/84   Pulse 84   Temp 98 F (36.7 C) (Oral)   Ht 5\' 8"  (1.727 m)   Wt 185 lb 6.4 oz (84.1 kg)   LMP  (LMP Unknown)   SpO2 97%   BMI 28.19 kg/m   BP Readings from Last 3 Encounters:  09/18/23 128/84  07/12/23 128/82  09/30/22 111/76   Wt Readings from Last 3 Encounters:  09/18/23 185 lb 6.4 oz (84.1 kg)  07/12/23 186 lb 9.6 oz (84.6 kg)  09/30/22 193 lb 4.8 oz (87.7 kg)    Physical Exam Vitals reviewed.  Constitutional:      Appearance: She is well-developed.  HENT:     Head: Normocephalic and atraumatic.     Jaw: No trismus, tenderness, swelling, pain on movement or malocclusion.     Comments: No pain with lateral motions of jaw or over TMJ.     Right Ear: Hearing, tympanic membrane, ear canal and external ear normal. No decreased hearing noted. No drainage, swelling or tenderness. No middle ear effusion. No foreign body. Tympanic membrane is not erythematous or bulging.     Left Ear: Hearing, tympanic membrane, ear canal and external ear normal. No decreased hearing noted. No drainage, swelling or tenderness.  No middle ear effusion. No foreign body. Tympanic membrane  is not erythematous or bulging.     Nose: Nose normal. No rhinorrhea.     Right Sinus: No maxillary sinus tenderness or frontal sinus tenderness.     Left Sinus: No maxillary sinus tenderness or frontal sinus tenderness.     Mouth/Throat:     Pharynx: Uvula midline. No oropharyngeal exudate or posterior oropharyngeal erythema.     Tonsils: No tonsillar abscesses.  Eyes:     Conjunctiva/sclera: Conjunctivae normal.  Cardiovascular:     Rate and Rhythm: Regular rhythm.     Pulses: Normal pulses.     Heart sounds: Normal heart sounds.  Pulmonary:     Effort: Pulmonary effort is normal.     Breath sounds: Normal breath  sounds. No wheezing, rhonchi or rales.  Lymphadenopathy:     Head:     Right side of head: No submental, submandibular, tonsillar, preauricular, posterior auricular or occipital adenopathy.     Left side of head: No submental, submandibular, tonsillar, preauricular, posterior auricular or occipital adenopathy.     Cervical: No cervical adenopathy.  Skin:    General: Skin is warm and dry.  Neurological:     Mental Status: She is alert.  Psychiatric:        Speech: Speech normal.        Behavior: Behavior normal.        Thought Content: Thought content normal.

## 2023-09-19 NOTE — Assessment & Plan Note (Signed)
Presentation consistent with otitis media.  Start short course of prednisone, and resume Xyzal.  she will let me know if symptom does not resolve

## 2023-10-07 ENCOUNTER — Telehealth: Admitting: Nurse Practitioner

## 2023-10-07 DIAGNOSIS — G43C0 Periodic headache syndromes in child or adult, not intractable: Secondary | ICD-10-CM

## 2023-10-07 MED ORDER — NAPROXEN 500 MG PO TABS: 500.0000 mg | ORAL_TABLET | Freq: Two times a day (BID) | ORAL | 0 refills | Status: AC

## 2023-10-07 NOTE — Progress Notes (Signed)
 E-Visit for Headache  We are sorry that you are not feeling well.  Here is how we plan to help!  Based on what you have shared with me I have sent naproxen to help with your headaches. I also recommend a nasal saline rinse if you are experiencing sinus congestion.     Get Help Right Away If: You develop worsening fever or sinus pain. You develop a severe head ache or visual changes. Your symptoms persist after you have completed your treatment plan.  Make sure you Understand these instructions. Will watch your condition. Will get help right away if you are not doing well or get worse.   Thank you for choosing an e-visit.  Your e-visit answers were reviewed by a board certified advanced clinical practitioner to complete your personal care plan. Depending upon the condition, your plan could have included both over the counter or prescription medications.  Please review your pharmacy choice. Make sure the pharmacy is open so you can pick up prescription now. If there is a problem, you may contact your provider through [employer] and have the prescription routed to another pharmacy.  Your safety is important to [place]. If you have drug allergies check your prescription carefully.   For the next 24 hours you can use MyChart to ask questions about today's visit, request a non-urgent call back, or ask for a work or school excuse. You will get an email in the next two days asking about your experience. I hope that your e-visit has been valuable and will speed your recovery.

## 2023-10-07 NOTE — Progress Notes (Signed)
 I have spent 5 minutes in review of e-visit questionnaire, review and updating patient chart, medical decision making and response to patient.   Claiborne Rigg, NP

## 2023-11-29 ENCOUNTER — Other Ambulatory Visit: Payer: Self-pay | Admitting: Obstetrics and Gynecology

## 2023-11-29 DIAGNOSIS — N921 Excessive and frequent menstruation with irregular cycle: Secondary | ICD-10-CM

## 2023-12-07 ENCOUNTER — Other Ambulatory Visit: Payer: Self-pay | Admitting: Obstetrics and Gynecology

## 2023-12-07 DIAGNOSIS — N921 Excessive and frequent menstruation with irregular cycle: Secondary | ICD-10-CM

## 2023-12-22 ENCOUNTER — Other Ambulatory Visit: Payer: Self-pay

## 2023-12-22 ENCOUNTER — Telehealth: Payer: Self-pay | Admitting: Gastroenterology

## 2023-12-22 ENCOUNTER — Other Ambulatory Visit: Payer: Self-pay | Admitting: Gastroenterology

## 2023-12-22 MED ORDER — LINACLOTIDE 145 MCG PO CAPS: 145.0000 ug | ORAL_CAPSULE | Freq: Every day | ORAL | 0 refills | Status: DC

## 2023-12-22 NOTE — Telephone Encounter (Signed)
 Refill sent to patients pharmacy.

## 2023-12-22 NOTE — Telephone Encounter (Signed)
 Inbound call from patient, would like refill for Linzess , called into [facility] [address]

## 2024-01-17 ENCOUNTER — Telehealth: Payer: Self-pay

## 2024-01-17 NOTE — Telephone Encounter (Signed)
 Copied from CRM [id number]. Topic: General - Other >> Jan 17, 2024 11:18 AM Annelle Kiel wrote: Reason for CRM: patient is requesting for dr arnett to fill her birth control meds her obgyn dr is out of the office and can't see her until aug she needs the birth control before then she tried to schedule a appt with dr Eber Goldsmith and another provider nothing was available  I also checked with another dr

## 2024-01-17 NOTE — Telephone Encounter (Signed)
 Copied from CRM [id number]. Topic: Appointments - Appointment Scheduling >> Jan 17, 2024 11:16 AM Annelle Kiel wrote: Patient/patient representative is calling to schedule an appointment. Refer to attachments for appointment information.

## 2024-01-17 NOTE — Telephone Encounter (Signed)
 LVM to call back to get appt scheduled for birth control sent pt my chart message as well

## 2024-01-17 NOTE — Telephone Encounter (Signed)
Pt has been scheduled for 02/14/24

## 2024-01-22 NOTE — Telephone Encounter (Signed)
 noted

## 2024-01-30 ENCOUNTER — Ambulatory Visit: Admitting: Obstetrics and Gynecology

## 2024-02-13 ENCOUNTER — Ambulatory Visit: Admitting: Gastroenterology

## 2024-02-13 ENCOUNTER — Encounter: Payer: Self-pay | Admitting: Gastroenterology

## 2024-02-13 VITALS — BP 104/64 | HR 82 | Ht 68.0 in | Wt 174.0 lb

## 2024-02-13 DIAGNOSIS — K581 Irritable bowel syndrome with constipation: Secondary | ICD-10-CM

## 2024-02-13 MED ORDER — LINACLOTIDE 290 MCG PO CAPS
290.0000 ug | ORAL_CAPSULE | Freq: Every day | ORAL | Status: DC
Start: 2024-02-13 — End: 2024-04-17

## 2024-02-13 NOTE — Progress Notes (Signed)
 Discussed the use of AI scribe software for clinical note transcription with the patient, who gave verbal consent to proceed.  HPI : Rachel Compton is a 33 year old female who presents for follow up of chronic constipation and abdominal bloating/discomfort.  She was last seen by me in May 2023 at which time she was prescribed Linzess .  Today, she states that she finds effective but does not take daily. Her bowel movement frequency is approximately three to four times per week, with occasional days without bowel movements despite taking Linzess .  Prior to Linzess , she was having one bowel movement per week on average.   Her stools are soft and of good volume, and she does not experience diarrhea.  She states that taking Linzess  daily did not improve her stool frequency more than taking it every other day.  She experiences a sensation of bloating, which improves slightly after bowel movements. She has not used enemas or suppositories recently and does not take other laxatives aside from pre and probiotics.  No concerning symptoms such as blood in the stool or unintentional weight loss. Her current medications include Xyzal  for allergies, birth control pills, and a daily multivitamin.  She is actively cutting calories and logs her food intake. Her diet includes keto bread and fresh vegetables, aiming to meet a fiber goal of 20 grams per day. She is trying to lose weight and is mindful of her fiber intake.      Past Medical History:  Diagnosis Date   Asthma    Chicken pox    Migraine    04/22/08 MRI normal    Stevens-Johnson syndrome (HCC)    b/l eyes surgery at [facility]     Past Surgical History:  Procedure Laterality Date   EYE SURGERY  03/2020   NO PAST SURGERIES     Family History  Adopted: Yes  Problem Relation Age of Onset   Cancer Neg Hx    Diabetes Neg Hx    Hyperlipidemia Neg Hx    Hypertension Neg Hx    Thyroid  disease Neg Hx    Social History   Tobacco Use    Smoking status: Never   Smokeless tobacco: Never  Vaping Use   Vaping status: Former  Substance Use Topics   Alcohol use: Yes    Alcohol/week: 5.0 standard drinks of alcohol    Types: 5 Standard drinks or equivalent per week    Comment: social   Drug use: No   Current Outpatient Medications  Medication Sig Dispense Refill   azelastine  (ASTELIN ) 0.1 % nasal spray Place 1 spray into both nostrils 2 (two) times daily. Use in each nostril as directed 30 mL 4   KARIVA  0.15-0.02/0.01 MG (21/5) tablet TAKE 1 TABLET BY MOUTH EVERYDAY AT BEDTIME 84 tablet 0   levocetirizine (XYZAL ) 5 MG tablet Take 1 tablet (5 mg total) by mouth every evening. 90 tablet 3   linaclotide  (LINZESS ) 145 MCG CAPS capsule Take 1 capsule (145 mcg total) by mouth daily before breakfast. 90 capsule 0   predniSONE  (DELTASONE ) 10 MG tablet Take 40 mg by mouth on day 1, then taper 10 mg daily until gone 10 tablet 0   No current facility-administered medications for this visit.   Allergies  Allergen Reactions   Penicillins Hives     Review of Systems: All systems reviewed and negative except where noted in HPI.    No results found.  Physical Exam: BP 104/64   Pulse 82  Ht 5' 8 (1.727 m)   Wt 174 lb (78.9 kg)   BMI 26.46 kg/m  Constitutional: Pleasant,well-developed, African American female in no acute distress. HEENT: Normocephalic and atraumatic. Conjunctivae are normal. No scleral icterus. Neurological: Alert and oriented to person place and time. Skin: Skin is warm and dry. No rashes noted. Psychiatric: Normal mood and affect. Behavior is normal.  CBC    Component Value Date/Time   WBC 6.2 06/17/2020 1122   WBC 7.5 04/30/2020 1500   RBC 4.28 06/17/2020 1122   RBC 3.96 04/30/2020 1500   HGB 13.3 06/17/2020 1122   HCT 37.8 06/17/2020 1122   PLT 303.0 04/30/2020 1500   MCV 88 06/17/2020 1122   MCH 31.1 06/17/2020 1122   MCH 29.0 03/14/2020 0323   MCHC 35.2 06/17/2020 1122   MCHC 33.4  04/30/2020 1500   RDW 12.5 06/17/2020 1122   LYMPHSABS 2.1 06/17/2020 1122   MONOABS 0.5 04/30/2020 1500   EOSABS 0.5 (H) 06/17/2020 1122   BASOSABS 0.1 06/17/2020 1122    CMP     Component Value Date/Time   NA 139 03/25/2020 1439   K 3.6 03/25/2020 1439   CL 99 03/25/2020 1439   CO2 30 03/25/2020 1439   GLUCOSE 116 (H) 03/25/2020 1439   BUN 10 03/25/2020 1439   CREATININE 0.88 03/25/2020 1439   CALCIUM 9.5 03/25/2020 1439   PROT 6.9 03/25/2020 1439   ALBUMIN 3.9 03/25/2020 1439   AST 14 03/25/2020 1439   ALT 34 03/25/2020 1439   ALKPHOS 56 03/25/2020 1439   BILITOT 0.4 03/25/2020 1439   GFRNONAA >60 03/14/2020 0323   GFRAA >60 03/14/2020 0323       Latest Ref Rng & Units 06/17/2020   11:22 AM 04/30/2020    3:00 PM 04/09/2020    1:43 PM  CBC EXTENDED  WBC 3.4 - 10.8 x10E3/uL 6.2  7.5  12.7   RBC 3.77 - 5.28 x10E6/uL 4.28  3.96  4.43   Hemoglobin 11.1 - 15.9 g/dL 86.6  87.6  86.4   HCT 34.0 - 46.6 % 37.8  36.8  40.6   Platelets 150.0 - 400.0 K/uL  303.0  229.0   NEUT# 1.4 - 7.0 x10E3/uL 3.2  4.8  11.8   Lymph# 0.7 - 3.1 x10E3/uL 2.1  2.0  0.8       ASSESSMENT AND PLAN:  33 year old female with chronic constipation and abdominal bloating/discomfort, with partial improvement on once daily Linzess  145 mcg.  She continues to have bowel movements only every 2-3 days and would prefer more frequent bowel movements.     IBS-C Chronic constipation/IBS managed with Linzess , effective for bowel movements but bloating persists and bowel frequency not at goal.  - Will try higher dose of Linzess  ( ); samples provided - If higher dose more effective, will change prescription - Advised increasing dietary fiber intake beyond 20 grams daily.  Recording duration: 8 minutes      Mir Fullilove E. Stacia, MD [facility]   Arnett, Rollene MATSU, FNP

## 2024-02-13 NOTE — Patient Instructions (Addendum)
 _______________________________________________________  If your blood pressure at your visit was 140/90 or greater, please contact your primary care physician to follow up on this.  _______________________________________________________  If you are age 35 or older, your body mass index should be between 23-30. Your Body mass index is 26.46 kg/m. If this is out of the aforementioned range listed, please consider follow up with your Primary Care Provider.  If you are age 47 or younger, your body mass index should be between 19-25. Your Body mass index is 26.46 kg/m. If this is out of the aformentioned range listed, please consider follow up with your Primary Care Provider.   ________________________________________________________  The [facility] providers would like to encourage you to use MYCHART to communicate with providers for non-urgent requests or questions.  Due to long hold times on the telephone, sending your provider a message by [facility] may be a faster and more efficient way to get a response.  Please allow 48 business hours for a response.  Please remember that this is for non-urgent requests.  _______________________________________________________  INCREASE: Linzess  to 290mcg daily for several days and if this works well we will send in a prescription   Please let us  know by sending a MyChart message or call us  at [phone].  Thank you for entrusting me with your care and choosing [facility].  Dr Stacia

## 2024-02-14 ENCOUNTER — Ambulatory Visit (INDEPENDENT_AMBULATORY_CARE_PROVIDER_SITE_OTHER): Admitting: Family

## 2024-02-14 VITALS — BP 126/76 | HR 96 | Temp 97.6°F | Ht 68.0 in | Wt 176.8 lb

## 2024-02-14 DIAGNOSIS — N921 Excessive and frequent menstruation with irregular cycle: Secondary | ICD-10-CM | POA: Diagnosis not present

## 2024-02-14 DIAGNOSIS — R229 Localized swelling, mass and lump, unspecified: Secondary | ICD-10-CM | POA: Diagnosis not present

## 2024-02-14 DIAGNOSIS — N939 Abnormal uterine and vaginal bleeding, unspecified: Secondary | ICD-10-CM | POA: Diagnosis not present

## 2024-02-14 MED ORDER — DESOGESTREL-ETHINYL ESTRADIOL 0.15-0.02/0.01 MG (21/5) PO TABS: ORAL_TABLET | ORAL | 3 refills | Status: AC

## 2024-02-14 NOTE — Progress Notes (Signed)
 Assessment & Plan:  Mass of skin Assessment & Plan: C/w lipoma. Pending US  to confirm.   Orders: -     US  SOFT TISSUE RT UPPER EXTREMITY LTD (NON-VASCULAR); Future -     US  SOFT TISSUE UPPER EXTREMITY LIMITED LEFT (NON-VASCULAR); Future  Breakthrough bleeding on OCPs -     Desogestrel -Ethinyl Estradiol ; TAKE 1 TABLET BY MOUTH EVERYDAY  Dispense: 90 tablet; Refill: 3  Abnormal vaginal bleeding Assessment & Plan: Resolved. Doing very well on mircette . Refilled today. She is no longer following with Dr Sherrilyn. She will return for CPE/PAP smear      Return precautions given.   Risks, benefits, and alternatives of the medications and treatment plan prescribed today were discussed, and patient expressed understanding.   Education regarding symptom management and diagnosis given to patient on AVS either electronically or printed.  Return for Complete Physical Exam.  Rollene Northern, FNP  Subjective:    Patient ID: Rachel Compton, female    DOB: [birth year], 33 y.o.   MRN: [id number]  CC: Alajiah Dutkiewicz is a 33 y.o. female who presents today for follow up.   HPI: Complains of BL inner arm masses for one month.  She felt them for sometime, however now raised and more noticeable over the last month.   Nontender   No oral lesions.  No fever, chills, N, v     H/o SJS  Menses are monthly. Bloodflow is not heavy  She requests refill of birth control No longer on Aygestin  and TXA. Started on mircette  09/30/22 Dr Janit 09/30/22   No history of DVT, migraine with aura. Non smoker. No history of cancer. Patient states she's not pregnant or breast-feeding.No concern for STDs.    Allergies: Penicillins Current Outpatient Medications on File Prior to Visit  Medication Sig Dispense Refill   azelastine  (ASTELIN ) 0.1 % nasal spray Place 1 spray into both nostrils 2 (two) times daily. Use in each nostril as directed 30 mL 4   levocetirizine (XYZAL ) 5 MG tablet Take 1  tablet (5 mg total) by mouth every evening. 90 tablet 3   linaclotide  (LINZESS ) 290 MCG CAPS capsule Take 1 capsule (290 mcg total) by mouth daily before breakfast.     [DISCONTINUED] fexofenadine  (ALLEGRA  ALLERGY) 60 MG tablet Take 1 tablet (60 mg total) by mouth 2 (two) times daily. 120 tablet 1   No current facility-administered medications on file prior to visit.    Review of Systems  Constitutional:  Negative for chills and fever.  Respiratory:  Negative for cough.   Cardiovascular:  Negative for chest pain and palpitations.  Gastrointestinal:  Negative for nausea and vomiting.  Genitourinary:  Negative for difficulty urinating and menstrual problem.  Skin:  Negative for rash and wound.      Objective:    BP 126/76   Pulse 96   Temp 97.6 F (36.4 C) (Oral)   Ht 5' 8 (1.727 m)   Wt 176 lb 12.8 oz (80.2 kg)   LMP  (LMP Unknown)   SpO2 98%   BMI 26.88 kg/m  BP Readings from Last 3 Encounters:  02/14/24 126/76  02/13/24 104/64  09/18/23 128/84   Wt Readings from Last 3 Encounters:  02/14/24 176 lb 12.8 oz (80.2 kg)  02/13/24 174 lb (78.9 kg)  09/18/23 185 lb 6.4 oz (84.1 kg)    Physical Exam Vitals reviewed.  Constitutional:      Appearance: She is well-developed.  Eyes:     Conjunctiva/sclera: Conjunctivae normal.  Cardiovascular:     Rate and Rhythm: Normal rate and regular rhythm.     Pulses: Normal pulses.     Heart sounds: Normal heart sounds.  Pulmonary:     Effort: Pulmonary effort is normal.     Breath sounds: Normal breath sounds. No wheezing, rhonchi or rales.  Skin:    General: Skin is warm and dry.         Comments: 3 palpable masses, approx 2-3 cm right forearm .  2 palpable masses, approx 2-3 cm left forearm. Areas are nontender, nonfluctuant. No erythema.   Neurological:     Mental Status: She is alert.  Psychiatric:        Speech: Speech normal.        Behavior: Behavior normal.        Thought Content: Thought content normal.

## 2024-02-14 NOTE — Assessment & Plan Note (Signed)
 C/w lipoma. Pending US  to confirm.

## 2024-02-14 NOTE — Assessment & Plan Note (Signed)
 Resolved. Doing very well on mircette . Refilled today. She is no longer following with Dr Sherrilyn. She will return for CPE/PAP smear

## 2024-02-14 NOTE — Patient Instructions (Signed)
 Ultrasound ordered of arms  Birth control refilled  Let us  know if you dont hear back within a week in regards to an appointment being scheduled.   So that you are aware, if you are [facility] MyChart user , please pay attention to your MyChart messages as you may receive a MyChart message with a phone number to call and schedule this test/appointment own your own from our referral coordinator. This is a new process so I do not want you to miss this message.  If you are not a MyChart user, you will receive a phone call.

## 2024-02-21 ENCOUNTER — Ambulatory Visit

## 2024-03-20 ENCOUNTER — Encounter: Payer: Self-pay | Admitting: Family

## 2024-04-17 ENCOUNTER — Encounter: Payer: Self-pay | Admitting: Gastroenterology

## 2024-04-17 ENCOUNTER — Other Ambulatory Visit: Payer: Self-pay

## 2024-04-17 MED ORDER — LINACLOTIDE 290 MCG PO CAPS: 290.0000 ug | ORAL_CAPSULE | Freq: Every day | ORAL | 3 refills | Status: AC

## 2024-04-24 ENCOUNTER — Encounter: Admitting: Family

## 2024-05-01 ENCOUNTER — Encounter: Admitting: Family

## 2024-05-09 ENCOUNTER — Encounter: Admitting: Family

## 2024-06-05 ENCOUNTER — Ambulatory Visit: Admission: EM | Admit: 2024-06-05 | Discharge: 2024-06-05 | Disposition: A | Discharge status: Home / Self Care

## 2024-06-05 ENCOUNTER — Ambulatory Visit: Payer: Self-pay | Admitting: *Deleted

## 2024-06-05 ENCOUNTER — Other Ambulatory Visit: Payer: Self-pay

## 2024-06-05 DIAGNOSIS — B349 Viral infection, unspecified: Secondary | ICD-10-CM

## 2024-06-05 DIAGNOSIS — G43919 Migraine, unspecified, intractable, without status migrainosus: Secondary | ICD-10-CM

## 2024-06-05 DIAGNOSIS — R0981 Nasal congestion: Secondary | ICD-10-CM

## 2024-06-05 LAB — POC SOFIA SARS ANTIGEN FIA: SARS Coronavirus 2 Ag: NEGATIVE

## 2024-06-05 MED ORDER — DICLOFENAC SODIUM 50 MG PO TBEC: 50.0000 mg | DELAYED_RELEASE_TABLET | Freq: Two times a day (BID) | ORAL | 1 refills | Status: AC

## 2024-06-05 MED ORDER — KETOROLAC TROMETHAMINE 60 MG/2ML IM SOLN
60.0000 mg | Freq: Once | INTRAMUSCULAR | Status: AC
  Administered 2024-06-05: 60 mg via INTRAMUSCULAR

## 2024-06-05 NOTE — Telephone Encounter (Signed)
 Copied from CRM #[id number]. Topic: Appointments - Appointment Scheduling >> Jun 05, 2024  9:11 AM Ivette P wrote: Patient/patient representative is calling to schedule an appointment. Refer to attachments for appointment information.   PT called in to schedule with [facility]. Per pt being established at [facility], that is not in the region of that clinic.    Called [facility] to verify if pt can be seen at [facility]  and spoke to [facility] and was told that pt can be seen but no appt available. Advised pt and pt said she would go to urgent care.    Offered to grab triage nurse. Pt Declined NURSE TRIAGE and said she already spoke to one at [facility].

## 2024-06-05 NOTE — Discharge Instructions (Signed)
  1. Acute viral syndrome (Primary) - POC SARS Coronavirus 2 Ag complaint UC is negative for COVID-19  2. Intractable migraine without status migrainosus, unspecified migraine type - ketorolac (TORADOL) injection 60 mg given in UC for acute migraine. - diclofenac (VOLTAREN) 50 MG EC tablet; Take 1 tablet (50 mg total) by mouth 2 (two) times daily.  Dispense: 30 tablet; Refill: 1  -Continue to monitor symptoms for any change in severity if there is any escalation of current symptoms or development of new symptoms follow-up in ER for further evaluation and management.

## 2024-06-05 NOTE — Telephone Encounter (Signed)
 LVM to call back to check and see if she has been seen  @ UC

## 2024-06-05 NOTE — ED Provider Notes (Signed)
 [facility]  Note:  This document was prepared using Dragon voice recognition software and may include unintentional dictation errors.  MRN: [id number] DOB: 1991-02-28  Subjective:   Rachel Compton is a 33 y.o. female presenting for evaluation of headache, sinus pressure, nasal congestion x 6 days.  Patient reports that migraine has only been for the last 2 to 3 days but reports she started having nasal congestion and sinus congestion symptoms last Friday.  Patient has taken over-the-counter Benadryl , DayQuil/NyQuil, Excedrin Migraine, Tylenol , Sudafed with minimal improvement to symptoms.  Patient reports that headache is 7/10.  Denies any fever, cough, sneezing, body aches, fatigue.  Patient reports that multiple other members of her household have been sick with similar symptoms.  Denies any known exposure to COVID, strep, influenza.  No current facility-administered medications for this encounter.  Current Outpatient Medications:    diclofenac (VOLTAREN) 50 MG EC tablet, Take 1 tablet (50 mg total) by mouth 2 (two) times daily., Disp: 30 tablet, Rfl: 1   azelastine  (ASTELIN ) 0.1 % nasal spray, Place 1 spray into both nostrils 2 (two) times daily. Use in each nostril as directed, Disp: 30 mL, Rfl: 4   desogestrel -ethinyl estradiol  (KARIVA ) 0.15-0.02/0.01 MG (21/5) tablet, TAKE 1 TABLET BY MOUTH EVERYDAY, Disp: 90 tablet, Rfl: 3   levocetirizine (XYZAL ) 5 MG tablet, Take 1 tablet (5 mg total) by mouth every evening., Disp: 90 tablet, Rfl: 3   linaclotide  (LINZESS ) 290 MCG CAPS capsule, Take 1 capsule (290 mcg total) by mouth daily before breakfast., Disp: 30 capsule, Rfl: 3   Allergies  Allergen Reactions   Penicillins Hives    Past Medical History:  Diagnosis Date   Asthma    Chicken pox    Migraine    04/22/08 MRI normal    Stevens-Johnson syndrome    b/l eyes surgery at [facility]     Past Surgical History:  Procedure Laterality Date   EYE SURGERY   03/2020   NO PAST SURGERIES      Family History  Adopted: Yes  Problem Relation Age of Onset   Cancer Neg Hx    Diabetes Neg Hx    Hyperlipidemia Neg Hx    Hypertension Neg Hx    Thyroid  disease Neg Hx     Social History   Tobacco Use   Smoking status: Never   Smokeless tobacco: Never  Vaping Use   Vaping status: Former  Substance Use Topics   Alcohol use: Yes    Alcohol/week: 5.0 standard drinks of alcohol    Types: 5 Standard drinks or equivalent per week    Comment: social   Drug use: No    ROS Refer to HPI for ROS details.  Objective:   Vitals: BP 113/79 (BP Location: Right Arm)   Pulse 82   Temp 97.8 F (36.6 C) (Oral)   Resp 16   Ht 5' 8 (1.727 m)   Wt 175 lb (79.4 kg)   LMP 06/03/2024 (Exact Date)   SpO2 97%   BMI 26.61 kg/m   Physical Exam Vitals and nursing note reviewed.  Constitutional:      General: She is not in acute distress.    Appearance: Normal appearance. She is well-developed. She is not ill-appearing or toxic-appearing.  HENT:     Head: Normocephalic and atraumatic.     Right Ear: Ear canal and external ear normal. A middle ear effusion is present. Tympanic membrane is not injected, erythematous or bulging.     Left  Ear: Ear canal and external ear normal. A middle ear effusion is present. Tympanic membrane is not injected, erythematous or bulging.     Nose: Mucosal edema and congestion present. No nasal tenderness or rhinorrhea.     Right Turbinates: Swollen.     Left Turbinates: Swollen.     Right Sinus: No maxillary sinus tenderness or frontal sinus tenderness.     Left Sinus: No maxillary sinus tenderness or frontal sinus tenderness.     Mouth/Throat:     Mouth: Mucous membranes are moist.     Pharynx: Oropharynx is clear. No oropharyngeal exudate or posterior oropharyngeal erythema.  Cardiovascular:     Rate and Rhythm: Normal rate.  Pulmonary:     Effort: Pulmonary effort is normal. No respiratory distress.     Breath  sounds: No stridor. No wheezing.  Skin:    General: Skin is warm and dry.  Neurological:     General: No focal deficit present.     Mental Status: She is alert and oriented to person, place, and time.  Psychiatric:        Mood and Affect: Mood normal.        Behavior: Behavior normal.     Procedures  Results for orders placed or performed during the hospital encounter of 06/05/24 (from the past 24 hours)  POC SARS Coronavirus 2 Ag     Status: None   Collection Time: 06/05/24 10:56 AM  Result Value Ref Range   SARS Coronavirus 2 Ag Negative Negative    No results found.   Assessment and Plan :     Discharge Instructions       1. Acute viral syndrome (Primary) - POC SARS Coronavirus 2 Ag complaint UC is negative for COVID-19  2. Intractable migraine without status migrainosus, unspecified migraine type - ketorolac (TORADOL) injection 60 mg given in UC for acute migraine. - diclofenac (VOLTAREN) 50 MG EC tablet; Take 1 tablet (50 mg total) by mouth 2 (two) times daily.  Dispense: 30 tablet; Refill: 1  -Continue to monitor symptoms for any change in severity if there is any escalation of current symptoms or development of new symptoms follow-up in ER for further evaluation and management.      Genevieve Ritzel B Melaya [place]   Hollee Fate, [place] B, [place] 06/05/24 1128

## 2024-06-05 NOTE — ED Triage Notes (Signed)
 Pt presents with complaints of sinus pressure accompanied by migraine x 3 days. Has taken OTC Sudafed, Benadryl , Dayquil/Nyquil, Excedrin, and Tylenol  taken at home with temporary improvement/relief. Currently rates overall pain a 7/10. No fevers at home. All family members in house have been ill with similar symptoms.

## 2024-06-05 NOTE — Telephone Encounter (Signed)
 FYI Only or Action Required?: FYI only for provider: no open appointment- patient declines alternative provider.  Patient was last seen in primary care on 02/14/2024 by Dineen Rollene MATSU, FNP.  Called Nurse Triage reporting Sinusitis.  Symptoms began several days ago.  Interventions attempted: OTC medications: Excedrin migraine.  Symptoms are: gradually worsening.  Triage Disposition: See HCP Within 4 Hours (Or PCP Triage)  Patient/caregiver understands and will follow disposition?: No, refuses disposition   Copied from CRM #[id number]. Topic: Clinical - Red Word Triage >> Jun 05, 2024  8:37 AM Viola FALCON wrote: Red Word that prompted transfer to Nurse Triage: Patient having and migraines and sinus pressure - requesting appt Reason for Disposition  [1] SEVERE headache (e.g., excruciating) AND [2] not improved after 2 hours of pain medicine  Answer Assessment - Initial Assessment Questions 1. LOCATION: Where does it hurt?      Ear pain, sinus pressure 2. ONSET: When did the sinus pain start?  (e.g., hours, days)      Started Friday 3. SEVERITY: How bad is the pain?   (Scale 0-10; or none, mild, moderate or severe)     8/10 4. RECURRENT SYMPTOM: Have you ever had sinus problems before? If Yes, ask: When was the last time? and What happened that time?      Yes- 1 year- unsure treatment 5. NASAL CONGESTION: Is the nose blocked? If Yes, ask: Can you open it or must you breathe through your mouth?     Took sudafed- open now 6. NASAL DISCHARGE: Do you have discharge from your nose? If so ask, What color?     clear 7. FEVER: Do you have a fever? If Yes, ask: What is it, how was it measured, and when did it start?      no 8. OTHER SYMPTOMS: Do you have any other symptoms? (e.g., sore throat, cough, earache, difficulty breathing)     Migraines, ear pain- yesterday- R today- L  Answer Assessment - Initial Assessment Questions 1. LOCATION: Where does it hurt?       Center/left of the head 2. ONSET: When did the headache start? (e.g., minutes, hours, days)      Started 3 days ago 3. PATTERN: Does the pain come and go, or has it been constant since it started?     Eases with OTC- but comes back 4. SEVERITY: How bad is the pain? and What does it keep you from doing?  (e.g., Scale 1-10; mild, moderate, or severe)     8/10 5. RECURRENT SYMPTOM: Have you ever had headaches before? If Yes, ask: When was the last time? and What happened that time?      yes 6. CAUSE: What do you think is causing the headache?     Not sure- possible sinus pressure 7. MIGRAINE: Have you been diagnosed with migraine headaches? If Yes, ask: Is this headache similar?      Yes- uses OTC medication- Excedrin migraine 8. HEAD INJURY: Has there been any recent injury to your head?      no 9. OTHER SYMPTOMS: Do you have any other symptoms? (e.g., fever, stiff neck, eye pain, sore throat, cold symptoms)     Sinus symptoms only  Protocols used: Sinus Pain or Congestion-A-AH, Headache-A-AH

## 2024-06-06 NOTE — Telephone Encounter (Signed)
 Spoke to pt she was seen at [facility]

## 2024-06-11 ENCOUNTER — Encounter: Admitting: Family

## 2024-07-02 ENCOUNTER — Encounter: Admitting: Family

## 2024-08-01 ENCOUNTER — Ambulatory Visit: Payer: Self-pay

## 2024-08-06 ENCOUNTER — Encounter: Admitting: Family

## 2024-08-26 ENCOUNTER — Encounter: Admitting: Family

## 2024-09-04 ENCOUNTER — Encounter: Payer: Self-pay | Admitting: Family

## 2024-09-04 ENCOUNTER — Ambulatory Visit: Admitting: Family

## 2024-09-04 ENCOUNTER — Telehealth: Payer: Self-pay | Admitting: Family

## 2024-09-04 DIAGNOSIS — F411 Generalized anxiety disorder: Secondary | ICD-10-CM

## 2024-09-04 DIAGNOSIS — R41840 Attention and concentration deficit: Secondary | ICD-10-CM

## 2024-09-04 MED ORDER — ESCITALOPRAM OXALATE 5 MG PO TABS: 5.0000 mg | ORAL_TABLET | Freq: Every day | ORAL | 3 refills | Status: AC

## 2024-09-04 NOTE — Telephone Encounter (Signed)
 I called to let patient know that appointment 09/04/2024 wanted to be with move due to provider schedule.    She discussed with me that she has had more anxiety of late.  Denies depression.  Denies suicidal ideation, suicide plan.  She started going to therapy and her therapist mentioned starting a medication to help with her anxiety.  She is back in school.  She has 2 young children 83 and 35 years old.    She is also concerned in regards to trouble concentrating. She is appointment with me 09/20/2024.  We agreed to go ahead and start Lexapro  ahead of visit.  She will continue counseling.  Referral made for ADHD.  She will let me know if she needs to be sooner than upcoming appointment

## 2024-09-20 ENCOUNTER — Encounter: Admitting: Family
